# Patient Record
Sex: Female | Born: 1950 | Race: White | Hispanic: No | Marital: Married | Smoking: Former smoker
Health system: Southern US, Community
[De-identification: ages and names within clinical notes are randomized; demographics above are authoritative.]

## PROBLEM LIST (undated history)

## (undated) DIAGNOSIS — A64 Unspecified sexually transmitted disease: Secondary | ICD-10-CM

## (undated) DIAGNOSIS — F32A Depression, unspecified: Secondary | ICD-10-CM

## (undated) DIAGNOSIS — C50919 Malignant neoplasm of unspecified site of unspecified female breast: Secondary | ICD-10-CM

## (undated) DIAGNOSIS — F329 Major depressive disorder, single episode, unspecified: Secondary | ICD-10-CM

## (undated) DIAGNOSIS — F419 Anxiety disorder, unspecified: Secondary | ICD-10-CM

## (undated) DIAGNOSIS — M810 Age-related osteoporosis without current pathological fracture: Secondary | ICD-10-CM

## (undated) DIAGNOSIS — I10 Essential (primary) hypertension: Secondary | ICD-10-CM

## (undated) HISTORY — DX: Anxiety disorder, unspecified: F41.9

## (undated) HISTORY — PX: KNEE SURGERY: SHX244

## (undated) HISTORY — DX: Major depressive disorder, single episode, unspecified: F32.9

## (undated) HISTORY — PX: OTHER SURGICAL HISTORY: SHX169

## (undated) HISTORY — PX: COLPOSCOPY: SHX161

## (undated) HISTORY — DX: Unspecified sexually transmitted disease: A64

## (undated) HISTORY — DX: Malignant neoplasm of unspecified site of unspecified female breast: C50.919

## (undated) HISTORY — DX: Depression, unspecified: F32.A

## (undated) HISTORY — DX: Essential (primary) hypertension: I10

## (undated) HISTORY — DX: Age-related osteoporosis without current pathological fracture: M81.0

---

## 2008-09-25 HISTORY — PX: OTHER SURGICAL HISTORY: SHX169

## 2010-01-25 HISTORY — PX: MASTECTOMY: SHX3

## 2011-11-10 ENCOUNTER — Encounter: Payer: Self-pay | Admitting: Family Medicine

## 2011-12-14 ENCOUNTER — Encounter: Payer: Self-pay | Admitting: *Deleted

## 2011-12-14 DIAGNOSIS — C50919 Malignant neoplasm of unspecified site of unspecified female breast: Secondary | ICD-10-CM | POA: Insufficient documentation

## 2011-12-14 DIAGNOSIS — I10 Essential (primary) hypertension: Secondary | ICD-10-CM | POA: Insufficient documentation

## 2011-12-14 DIAGNOSIS — F329 Major depressive disorder, single episode, unspecified: Secondary | ICD-10-CM | POA: Insufficient documentation

## 2011-12-15 ENCOUNTER — Ambulatory Visit (INDEPENDENT_AMBULATORY_CARE_PROVIDER_SITE_OTHER): Payer: No Typology Code available for payment source | Admitting: Family Medicine

## 2011-12-15 ENCOUNTER — Encounter: Payer: Self-pay | Admitting: Family Medicine

## 2011-12-15 VITALS — BP 103/67 | HR 69 | Temp 97.4°F | Resp 16 | Ht 65.5 in | Wt 153.2 lb

## 2011-12-15 DIAGNOSIS — F341 Dysthymic disorder: Secondary | ICD-10-CM

## 2011-12-15 DIAGNOSIS — Z0000 Encounter for general adult medical examination without abnormal findings: Secondary | ICD-10-CM

## 2011-12-15 DIAGNOSIS — J301 Allergic rhinitis due to pollen: Secondary | ICD-10-CM

## 2011-12-15 DIAGNOSIS — I10 Essential (primary) hypertension: Secondary | ICD-10-CM

## 2011-12-15 DIAGNOSIS — M8580 Other specified disorders of bone density and structure, unspecified site: Secondary | ICD-10-CM

## 2011-12-15 DIAGNOSIS — M899 Disorder of bone, unspecified: Secondary | ICD-10-CM

## 2011-12-15 DIAGNOSIS — F329 Major depressive disorder, single episode, unspecified: Secondary | ICD-10-CM

## 2011-12-15 MED ORDER — ESCITALOPRAM OXALATE 20 MG PO TABS: 20.0000 mg | ORAL_TABLET | Freq: Every day | ORAL | Status: DC

## 2011-12-15 MED ORDER — FLUTICASONE PROPIONATE 50 MCG/ACT NA SUSP: 1.0000 | Freq: Every day | NASAL | Status: DC

## 2011-12-15 MED ORDER — LISINOPRIL-HYDROCHLOROTHIAZIDE 10-12.5 MG PO TABS: 1.0000 | ORAL_TABLET | Freq: Every day | ORAL | Status: DC

## 2011-12-15 MED ORDER — AZELASTINE HCL 0.05 % OP SOLN: 1.0000 [drp] | Freq: Two times a day (BID) | OPHTHALMIC | Status: AC

## 2011-12-15 NOTE — Patient Instructions (Addendum)
Allergies - continue allegra, flonase, and try optivar - if not improving - return for recheck.   Keeping You Healthy  Get These Tests  Blood Pressure- Have your blood pressure checked by your healthcare provider at least once a year.  Normal blood pressure is 120/80.  Weight- Have your body mass index (BMI) calculated to screen for obesity.  BMI is a measure of body fat based on height and weight.  You can calculate your own BMI at https://www.west-esparza.com/  Cholesterol- Have your cholesterol checked every year.  Diabetes- Have your blood sugar checked every year if you have high blood pressure, high cholesterol, a family history of diabetes or if you are overweight.  Pap Smear- Have a pap smear every 1 to 3 years if you have been sexually active.  If you are older than 65 and recent pap smears have been normal you may not need additional pap smears.  In addition, if you have had a hysterectomy  For benign disease additional pap smears are not necessary.  Mammogram-Yearly mammograms are essential for early detection of breast cancer  Screening for Colon Cancer- Colonoscopy starting at age 62. Screening may begin sooner depending on your family history and other health conditions.  Follow up colonoscopy as directed by your Gastroenterologist.  Screening for Osteoporosis- Screening begins at age 68 with bone density scanning, sooner if you are at higher risk for developing Osteoporosis.  Get these medicines  Calcium with Vitamin D- Your body requires 1200-1500 mg of Calcium a day and [phone] IU of Vitamin D a day.  You can only absorb 500 mg of Calcium at a time therefore Calcium must be taken in 2 or 3 separate doses throughout the day.  Hormones- Hormone therapy has been associated with increased risk for certain cancers and heart disease.  Talk to your healthcare provider about if you need relief from menopausal symptoms.  Aspirin- Ask your healthcare provider about taking Aspirin to  prevent Heart Disease and Stroke.  Get these Immuniztions  Flu shot- Every fall  Pneumonia shot- Once after the age of 59; if you are younger ask your healthcare provider if you need a pneumonia shot.  Tetanus- Every ten years.  Zostavax- Once after the age of 87 to prevent shingles.  Take these steps  Don't smoke- Your healthcare provider can help you quit. For tips on how to quit, ask your healthcare provider or go to www.smokefree.gov or call 1-800 QUIT-NOW.  Be physically active- Exercise 5 days a week for a minimum of 30 minutes.  If you are not already physically active, start slow and gradually work up to 30 minutes of moderate physical activity.  Try walking, dancing, bike riding, swimming, etc.  Eat a healthy diet- Eat a variety of healthy foods such as fruits, vegetables, whole grains, low fat milk, low fat cheeses, yogurt, lean meats, chicken, fish, eggs, dried beans, tofu, etc.  For more information go to www.thenutritionsource.org  Dental visit- Brush and floss teeth twice daily; visit your dentist twice a year.  Eye exam- Visit your Optometrist or Ophthalmologist yearly.  Drink alcohol in moderation- Limit alcohol intake to one drink or less a day.  Never drink and drive.  Depression- Your emotional health is as important as your physical health.  If you're feeling down or losing interest in things you normally enjoy, please talk to your healthcare provider.  Seat Belts- can save your life; always wear one  Smoke/Carbon Monoxide detectors- These detectors need to be installed on  the appropriate level of your home.  Replace batteries at least once a year.  Violence- If anyone is threatening or hurting you, please tell your healthcare provider.  Living Will/ Health care power of attorney- Discuss with your healthcare provider and family.

## 2011-12-15 NOTE — Progress Notes (Signed)
  Subjective:    Patient ID: Angelica Patton, female    DOB: 03/15/1951, 61 y.o.   MRN: [id number]  HPI [name] is a 61 y.o. female here for CPE. See pt health survey -  HTN - tolerating meds. Depression -  Allergies - zyrtec too sleepy.  Allegra, Flonase, works better, but eyes red, itchy.  Cats at home.  F  Review of Systems See 13pt ROS on PHS - reviewed.      Objective:   Physical Exam  Constitutional: She is oriented to person, place, and time. She appears well-developed and well-nourished.  HENT:  Head: Normocephalic and atraumatic.  Right Ear: External ear normal.  Left Ear: External ear normal.  Mouth/Throat: No oropharyngeal exudate.  Eyes: Conjunctivae and EOM are normal. Pupils are equal, round, and reactive to light.  Neck: Normal range of motion. No JVD present. No thyromegaly present.  Cardiovascular: Normal rate, regular rhythm and normal heart sounds.   No murmur heard. Pulmonary/Chest: Effort normal and breath sounds normal. She has no rales.  Abdominal: Soft. Bowel sounds are normal. There is no tenderness.  Musculoskeletal: Normal range of motion.  Lymphadenopathy:    She has no cervical adenopathy.  Neurological: She is alert and oriented to person, place, and time.  Skin: Skin is warm and dry.  Psychiatric: She has a normal mood and affect. Her behavior is normal.         Assessment & Plan:  [name] is a 61 y.o. female 1. Annual physical exam  DG Bone Density, Comprehensive metabolic panel, Lipid panel, CBC with Differential, TSH  2. Depression    3. HTN (hypertension)  Comprehensive metabolic panel, Lipid panel, CBC with Differential, TSH  4. Osteopenia  DG Bone Density  5. Allergic rhinitis     Check labs as above, add optivar for allergies - consider immunocap testing.  Stable BP and depression.  No change in meds.  Hx osteopenia, but off meds at previous doctors request.  Ca and vit d otc, recheck Dexa.  Has OBGYN for pap testing  follow up and f/u breast CA.  Recheck 6months.

## 2011-12-16 LAB — COMPREHENSIVE METABOLIC PANEL
AST: 16 U/L (ref 0–37)
Albumin: 4.3 g/dL (ref 3.5–5.2)
Alkaline Phosphatase: 46 U/L (ref 39–117)
BUN: 14 mg/dL (ref 6–23)
Creat: 0.85 mg/dL (ref 0.50–1.10)
Glucose, Bld: 96 mg/dL (ref 70–99)
Potassium: 4.2 mEq/L (ref 3.5–5.3)
Total Bilirubin: 0.3 mg/dL (ref 0.3–1.2)

## 2011-12-16 LAB — LIPID PANEL
HDL: 80 mg/dL (ref >39)
LDL Cholesterol: 70 mg/dL (ref 0–99)
Total CHOL/HDL Ratio: 2 Ratio
Triglycerides: 40 mg/dL (ref <150)

## 2011-12-16 LAB — CBC WITH DIFFERENTIAL/PLATELET
Basophils Absolute: 0.1 10*3/uL (ref 0.0–0.1)
Basophils Relative: 1 % (ref 0–1)
Eosinophils Relative: 2 % (ref 0–5)
HCT: 41.5 % (ref 36.0–46.0)
MCHC: 33.3 g/dL (ref 30.0–36.0)
Monocytes Absolute: 0.7 10*3/uL (ref 0.1–1.0)
Neutro Abs: 4.1 10*3/uL (ref 1.7–7.7)
Platelets: 272 10*3/uL (ref 150–400)
RDW: 12.7 % (ref 11.5–15.5)

## 2011-12-20 ENCOUNTER — Telehealth: Payer: Self-pay

## 2011-12-20 DIAGNOSIS — M8580 Other specified disorders of bone density and structure, unspecified site: Secondary | ICD-10-CM

## 2011-12-20 NOTE — Telephone Encounter (Signed)
1)  PT IS WAITING FOR HER REFERRAL FOR A BONE DENSITY TEST  2) PT IS WAITING FOR HER LAB RESULTS   PLEASE CALL [phone]

## 2011-12-21 NOTE — Telephone Encounter (Signed)
Dr. Neva Seat,  I see where lab results are in, and bone density ordered-- but referral is not listed in referrals section and I do not see where labs have been reviewed.  Please advise.

## 2011-12-25 NOTE — Telephone Encounter (Signed)
1)  PT IS WAITING FOR HER REFERRAL FOR A BONE DENSITY TEST  2) PT IS WAITING FOR HER LAB RESULTS   PLEASE CALL [phone]

## 2011-12-30 NOTE — Telephone Encounter (Signed)
I believe it just needs to be ordered as a dexa scan.  I will give it a try.

## 2011-12-30 NOTE — Telephone Encounter (Signed)
Referrals,  Please check and make sure Dexa scan was ordered for this patient.  We aren't sure if we did this right!  Pls notify Dr. Neva Seat if not.  Angelica Patton

## 2011-12-30 NOTE — Telephone Encounter (Signed)
Please arrange bone density test - I am not sure how this is to be placed in referrals.

## 2012-07-15 ENCOUNTER — Ambulatory Visit (INDEPENDENT_AMBULATORY_CARE_PROVIDER_SITE_OTHER): Payer: No Typology Code available for payment source | Admitting: Family Medicine

## 2012-07-15 ENCOUNTER — Encounter: Payer: Self-pay | Admitting: Family Medicine

## 2012-07-15 VITALS — BP 107/71 | HR 77 | Temp 97.6°F | Resp 16 | Ht 65.5 in | Wt 140.0 lb

## 2012-07-15 DIAGNOSIS — L709 Acne, unspecified: Secondary | ICD-10-CM

## 2012-07-15 DIAGNOSIS — I10 Essential (primary) hypertension: Secondary | ICD-10-CM

## 2012-07-15 DIAGNOSIS — R5383 Other fatigue: Secondary | ICD-10-CM

## 2012-07-15 DIAGNOSIS — F329 Major depressive disorder, single episode, unspecified: Secondary | ICD-10-CM

## 2012-07-15 DIAGNOSIS — Z23 Encounter for immunization: Secondary | ICD-10-CM

## 2012-07-15 DIAGNOSIS — L708 Other acne: Secondary | ICD-10-CM

## 2012-07-15 DIAGNOSIS — A6000 Herpesviral infection of urogenital system, unspecified: Secondary | ICD-10-CM

## 2012-07-15 MED ORDER — HYDROCHLOROTHIAZIDE 12.5 MG PO TABS: 12.5000 mg | ORAL_TABLET | Freq: Every day | ORAL | Status: DC

## 2012-07-15 MED ORDER — TRETINOIN 0.01 % EX GEL: Freq: Every day | CUTANEOUS | Status: DC

## 2012-07-15 MED ORDER — VALACYCLOVIR HCL 500 MG PO TABS: 500.0000 mg | ORAL_TABLET | ORAL | Status: DC | PRN

## 2012-07-15 MED ORDER — ESCITALOPRAM OXALATE 20 MG PO TABS: 20.0000 mg | ORAL_TABLET | Freq: Every day | ORAL | Status: DC

## 2012-07-15 NOTE — Progress Notes (Signed)
  Subjective:    Patient ID: Angelica Patton, female    DOB: Feb 21, 1951, 61 y.o.   MRN: [id number]  HPI Angelica Patton is a 61 y.o. female Has obgyn appt end of November.  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  Only needs to take every 3 or 4 weeks if feeling like symptoms are starting.  1 pill per day for 3 days.   Depression, with anxiety and worry at times.  takes lexapro 20mg  qd.  Had been taking 10mg  past year- had been doing well, no side effects.   Felt more "blah" past 6 weeks - more depressed. Down feeling. Rare etoh - rare glass or two of wine. No recent change in amount.  Has been quitting smoking. Exercising, but fatigued.  Sore with exercising - feels depressed about this.  Anhedonia past 2 weeks.   HTN - no outside BP's.  Has been feeling ok.   Hx of acne - used retin a in  past - didn't; get rx last time.   Review of Systems  Constitutional: Positive for fever. Negative for fatigue and unexpected weight change.  Respiratory: Negative for chest tightness and shortness of breath.   Cardiovascular: Negative for chest pain, palpitations and leg swelling.  Gastrointestinal: Negative for abdominal pain and blood in stool.  Neurological: Negative for dizziness, syncope, light-headedness and headaches.       Objective:   Physical Exam  Constitutional: She is oriented to person, place, and time. She appears well-developed and well-nourished.  HENT:  Head: Normocephalic and atraumatic.  Eyes: Conjunctivae normal and EOM are normal. Pupils are equal, round, and reactive to light.  Neck: Carotid bruit is not present. No thyromegaly present.  Cardiovascular: Normal rate, regular rhythm, normal heart sounds and intact distal pulses.   Pulmonary/Chest: Effort normal and breath sounds normal.  Abdominal: Soft. She exhibits no pulsatile midline mass. There is no tenderness.  Neurological: She is alert and oriented to person, place, and time.  Skin: Skin is warm and dry.    Psychiatric: She has a normal mood and affect. Her behavior is normal. Judgment and thought content normal.          Assessment & Plan:  Angelica Patton is a 61 y.o. female 1. HTN (hypertension)  hydrochlorothiazide (HYDRODIURIL) 12.5 MG tablet  2. Need for prophylactic vaccination and inoculation against influenza  Flu vaccine greater than or equal to 3yo preservative free IM  3. Depression  escitalopram (LEXAPRO) 20 MG tablet  4. Fatigue    5. Genital HSV  valACYclovir (VALTREX) 500 MG tablet  6. Acne  tretinoin (RETIN-A) 0.01 % gel    Genital HSV - discussed increasing dose of 500mg  to twice per day for 3 days if feels like having flair.  meds refilled - 30, 3 rf's. . Consider daily tx of increased frequency of flairs.   Depression with anxiety, recent increase of anhedonia.- increase lexapro back to 20mg  qd, and recheck in 4- 6 weeks.  Continue exercise.    HTN - great control today.  Possible overtreatment with contributor to fatigue.. Change to hctz 12.5mg  qd, and check home bp's - recheck in 4-6 weeks, with possibe labs then if fatigue persists.  Acne - refilled Retin A.  Flu vaccine given.

## 2012-07-15 NOTE — Patient Instructions (Addendum)
Keep a record of your blood pressures outside of the office and bring them to the next office visit. If remaining over 130/80, we can change back to previous medicine. Return to the clinic or go to the nearest emergency room if any of your symptoms worsen or new symptoms occur. Recheck in 4- 6 weeks.

## 2012-07-29 ENCOUNTER — Telehealth: Payer: Self-pay

## 2012-07-29 NOTE — Telephone Encounter (Signed)
I called patient and she states her BP has been 127/80, states when it is this high, it makes her feel bad.(she states she can feel it when her BP is this high) She states she no longer has the fatigue she had before. She states her BP on the other medication was 100/70, she wants to know what to do, does she need to continue or change to something else.

## 2012-07-29 NOTE — Telephone Encounter (Signed)
Pt called and would like to speak to clinical staff re: recent medication Hydrodiuril.  She is feeling better however her bp seems to be increasing.    [phone] best number.

## 2012-07-30 NOTE — Telephone Encounter (Signed)
See staff message

## 2012-08-05 NOTE — Telephone Encounter (Signed)
Left message for her, to call me back. Dr Neva Seat advised to continue with current treatment. Need to advise her.

## 2012-08-05 NOTE — Telephone Encounter (Signed)
Patient now states she is feeling better, has adjusted to the medication. She will see you in December. States thank you.  FYI only

## 2012-08-26 ENCOUNTER — Ambulatory Visit: Payer: No Typology Code available for payment source | Admitting: Family Medicine

## 2012-08-26 ENCOUNTER — Ambulatory Visit (INDEPENDENT_AMBULATORY_CARE_PROVIDER_SITE_OTHER): Payer: No Typology Code available for payment source | Admitting: Family Medicine

## 2012-08-26 VITALS — BP 120/80 | HR 82 | Temp 98.8°F | Resp 16 | Ht 66.5 in | Wt 150.0 lb

## 2012-08-26 DIAGNOSIS — F329 Major depressive disorder, single episode, unspecified: Secondary | ICD-10-CM

## 2012-08-26 DIAGNOSIS — M549 Dorsalgia, unspecified: Secondary | ICD-10-CM

## 2012-08-26 DIAGNOSIS — F341 Dysthymic disorder: Secondary | ICD-10-CM

## 2012-08-26 DIAGNOSIS — I10 Essential (primary) hypertension: Secondary | ICD-10-CM

## 2012-08-26 DIAGNOSIS — M546 Pain in thoracic spine: Secondary | ICD-10-CM

## 2012-08-26 MED ORDER — MELOXICAM 7.5 MG PO TABS: 7.5000 mg | ORAL_TABLET | Freq: Every day | ORAL | Status: DC

## 2012-08-26 NOTE — Patient Instructions (Signed)
For your back pain - work of on the stretches we discussed, heat, and if needed - I prescribed mobic to your pharmacy if needed - one per day. Watch blood pressures on this medicine - if going up - stop this medicine.   Continue same doses of other medications.   Recheck in 3 months.Keep a record of your blood pressures outside of the office and bring them to the next office visit. Return to the clinic or go to the nearest emergency room if any of your symptoms worsen or new symptoms occur.

## 2012-08-26 NOTE — Progress Notes (Signed)
Subjective:    Patient ID: Angelica Patton, female    DOB: [birth year], 61 y.o.   MRN: [id number]  HPI Angelica Patton is a 61 y.o. female  Last OV 07/15/12:  Depression with anxiety, recent increase of anhedonia.- increased lexapro back to 20mg  qd, recommended continued exercise.  Does not feel depressed.  No anhedonia. No SI. Few glasses of wine few times per week. No recent increase.  Mood is better and more energy. Has had some congestion, cough since flu shot.  HTN - great control last ov.   Possible overtreatment with contributor to fatigue then. Changed to hctz 12.5mg  qd. Telephone notes reviewed. Weight 140 last office visit. Feels better - more energy. Outside readings - 120/88. Feels good at this dose.   Back pain - started after cold symptoms after having flu shot.  Cough, used mucinexfor few weeks. Cough is better, but back sore for past 2 weeks - noted in evenings with more stress in day.  Had massage - felt tight. NKI, no new activities otherwise. Past 2 days better than in past few weeks. Tx: Advil - few times at night. Initially helped then less. More sore at night. No fever, no sob.  No hemoptysis. No weakness. Pain is between shoulder blades and moves to mid back.  No neck pain. No bowel or bladder incontinence, no saddle anesthesia, no lower extremity weakness.    Review of Systems  Constitutional: Negative for fever and chills.  Respiratory: Positive for cough. Negative for chest tightness.   Cardiovascular: Negative for chest pain.  Musculoskeletal: Positive for myalgias.  Neurological: Negative for dizziness and light-headedness.   As above.     Objective:   Physical Exam  Constitutional: She is oriented to person, place, and time. She appears well-developed and well-nourished.  HENT:  Head: Normocephalic and atraumatic.  Eyes: Conjunctivae normal and EOM are normal. Pupils are equal, round, and reactive to light.  Neck: Carotid bruit is not present.  Cardiovascular:  Normal rate, regular rhythm, normal heart sounds and intact distal pulses.   Pulmonary/Chest: Effort normal and breath sounds normal.  Abdominal: Soft. She exhibits no pulsatile midline mass. There is no tenderness.  Musculoskeletal:       Cervical back: She exhibits normal range of motion and no tenderness.       Thoracic back: She exhibits normal range of motion and no bony tenderness.       Lumbar back: She exhibits normal range of motion and no tenderness.       Back:  Neurological: She is alert and oriented to person, place, and time.  Skin: Skin is warm and dry.  Psychiatric: She has a normal mood and affect. Her behavior is normal.          Assessment & Plan:  Angelica Patton is a 61 y.o. female 1. Mid back pain  meloxicam (MOBIC) 7.5 MG tablet  2. HTN (hypertension)    3. Depression     Depression - controlled, continue same dose of Lexapro. Discussed increase in exercise.   HTN - controlled.  Continue same dose of HCTZ, and recheck in 3-6 months.  Mid back pain - rhomboid spasm/strain likely after cough with likely viral illness. Demonstrated exercise/stretces, trial of heat, stretches.  mobic if needed, rtc precautions - recheck if not continuing to improve next few weeks.   Patient Instructions  For your back pain - work of on the stretches we discussed, heat, and if needed - I prescribed mobic to your pharmacy  if needed - one per day. Watch blood pressures on this medicine - if going up - stop this medicine.   Continue same doses of other medications.   Recheck in 3 months.Keep a record of your blood pressures outside of the office and bring them to the next office visit. Return to the clinic or go to the nearest emergency room if any of your symptoms worsen or new symptoms occur.

## 2012-10-11 ENCOUNTER — Other Ambulatory Visit: Payer: Self-pay | Admitting: Family Medicine

## 2012-12-24 ENCOUNTER — Other Ambulatory Visit: Payer: Self-pay | Admitting: Family Medicine

## 2013-01-06 ENCOUNTER — Other Ambulatory Visit: Payer: Self-pay | Admitting: Physician Assistant

## 2013-01-30 ENCOUNTER — Other Ambulatory Visit: Payer: Self-pay | Admitting: Family Medicine

## 2013-02-24 ENCOUNTER — Other Ambulatory Visit: Payer: Self-pay | Admitting: Family Medicine

## 2013-03-24 ENCOUNTER — Ambulatory Visit: Payer: No Typology Code available for payment source | Admitting: Family Medicine

## 2013-03-31 ENCOUNTER — Encounter: Payer: Self-pay | Admitting: Family Medicine

## 2013-03-31 ENCOUNTER — Ambulatory Visit (INDEPENDENT_AMBULATORY_CARE_PROVIDER_SITE_OTHER): Payer: BC Managed Care – PPO | Admitting: Family Medicine

## 2013-03-31 VITALS — BP 106/70 | HR 84 | Temp 97.8°F | Resp 16 | Ht 66.0 in | Wt 146.0 lb

## 2013-03-31 DIAGNOSIS — F329 Major depressive disorder, single episode, unspecified: Secondary | ICD-10-CM

## 2013-03-31 DIAGNOSIS — I10 Essential (primary) hypertension: Secondary | ICD-10-CM

## 2013-03-31 DIAGNOSIS — L708 Other acne: Secondary | ICD-10-CM

## 2013-03-31 DIAGNOSIS — J309 Allergic rhinitis, unspecified: Secondary | ICD-10-CM

## 2013-03-31 DIAGNOSIS — L709 Acne, unspecified: Secondary | ICD-10-CM

## 2013-03-31 MED ORDER — TRETINOIN 0.05 % EX CREA: TOPICAL_CREAM | Freq: Every day | CUTANEOUS | Status: DC

## 2013-03-31 MED ORDER — ESCITALOPRAM OXALATE 20 MG PO TABS: 20.0000 mg | ORAL_TABLET | Freq: Every day | ORAL | Status: DC

## 2013-03-31 MED ORDER — HYDROCHLOROTHIAZIDE 12.5 MG PO CAPS: ORAL_CAPSULE | ORAL | Status: DC

## 2013-03-31 MED ORDER — FLUTICASONE PROPIONATE 50 MCG/ACT NA SUSP: NASAL | Status: DC

## 2013-03-31 NOTE — Progress Notes (Signed)
Subjective:    Patient ID: Angelica Patton, female    DOB: 18-Feb-1951, 62 y.o.   MRN: [id number]  HPI Angelica Patton is a 62 y.o. female  HTN - not checking home BP's.  No recent elevated levels. Less fatigue. Changed to 12.5mg  HCTZ last October of last year and has done well. Not fasting today. No new side effects of meds.   AR - no recent use of flonase as had some nasal irritation, stopped 2 months ago.  but now knows how to use correctly, so will restart.   Depression - mood doing ok.  No suicide thoughts, no anhedonia.  Recently retired from [employer]. No recent change in sleep.  Feels well otherwise.   Acne - pores around nose. Prior on 0.05 Retin A, last time had lower dose. Notices more pores. Would like to try higher dose again -  No skin irritation with this higher dose.   Results for orders placed in visit on 12/15/11  COMPREHENSIVE METABOLIC PANEL      Result Value Range   Sodium 140  135 - 145 mEq/L   Potassium 4.2  3.5 - 5.3 mEq/L   Chloride 105  96 - 112 mEq/L   CO2 27  19 - 32 mEq/L   Glucose, Bld 96  70 - 99 mg/dL   BUN 14  6 - 23 mg/dL   Creat 4.03  4.74 - 2.59 mg/dL   Total Bilirubin 0.3  0.3 - 1.2 mg/dL   Alkaline Phosphatase 46  39 - 117 U/L   AST 16  0 - 37 U/L   ALT 14  0 - 35 U/L   Total Protein 6.4  6.0 - 8.3 g/dL   Albumin 4.3  3.5 - 5.2 g/dL   Calcium 8.9  8.4 - 56.3 mg/dL  LIPID PANEL      Result Value Range   Cholesterol 158  0 - 200 mg/dL   Triglycerides 40  <875 mg/dL   HDL 80  >64 mg/dL   Total CHOL/HDL Ratio 2.0     VLDL 8  0 - 40 mg/dL   LDL Cholesterol 70  0 - 99 mg/dL  CBC WITH DIFFERENTIAL      Result Value Range   WBC 7.3  4.0 - 10.5 K/uL   RBC 4.50  3.87 - 5.11 MIL/uL   Hemoglobin 13.8  12.0 - 15.0 g/dL   HCT 33.2  95.1 - 88.4 %   MCV 92.2  78.0 - 100.0 fL   MCH 30.7  26.0 - 34.0 pg   MCHC 33.3  30.0 - 36.0 g/dL   RDW 16.6  06.3 - 01.6 %   Platelets 272  150 - 400 K/uL   Neutrophils Relative % 56  43 - 77 %   Neutro Abs  4.1  1.7 - 7.7 K/uL   Lymphocytes Relative 33  12 - 46 %   Lymphs Abs 2.4  0.7 - 4.0 K/uL   Monocytes Relative 9  3 - 12 %   Monocytes Absolute 0.7  0.1 - 1.0 K/uL   Eosinophils Relative 2  0 - 5 %   Eosinophils Absolute 0.1  0.0 - 0.7 K/uL   Basophils Relative 1  0 - 1 %   Basophils Absolute 0.1  0.0 - 0.1 K/uL   Smear Review Criteria for review not met    TSH      Result Value Range   TSH 3.409  0.350 - 4.500 uIU/mL  Review of Systems  Constitutional: Negative for fatigue and unexpected weight change.  Respiratory: Negative for chest tightness and shortness of breath.   Cardiovascular: Negative for chest pain, palpitations and leg swelling.  Gastrointestinal: Negative for abdominal pain and blood in stool.  Neurological: Negative for dizziness, syncope, light-headedness and headaches.       Objective:   Physical Exam  Vitals reviewed. Constitutional: She is oriented to person, place, and time. She appears well-developed and well-nourished.  HENT:  Head: Normocephalic and atraumatic.  Few dark scattered pores/small comedones lateral aspect of nose.   Eyes: Conjunctivae and EOM are normal. Pupils are equal, round, and reactive to light.  Neck: Carotid bruit is not present.  Cardiovascular: Normal rate, regular rhythm, normal heart sounds and intact distal pulses.   Pulmonary/Chest: Effort normal and breath sounds normal.  Abdominal: Soft. She exhibits no pulsatile midline mass. There is no tenderness.  Neurological: She is alert and oriented to person, place, and time.  Skin: Skin is warm and dry.  Psychiatric: She has a normal mood and affect. Her behavior is normal.       Assessment & Plan:  Angelica Patton is a 62 y.o. female HTN (hypertension) - Plan: hydrochlorothiazide (MICROZIDE) 12.5 MG capsule, Basic metabolic panel ordered.  meds refilled.   Acne - Plan: tretinoin (RETIN-A) 0.05 % cream ordered inplace of prior lower dose - only uses this intermittently.    Depression - stable.  Plan: escitalopram (LEXAPRO) 20 MG tablet refilled.   Allergic rhinitis - Plan: fluticasone (FLONASE) 50 MCG/ACT nasal spray - discussed "nose to toes, near to ear" technique for improved tolerance.  If still with irritation, consider other agent.   Plan on CPE with fasting bloodwork in 6 months.   Meds ordered this encounter  Medications  . tretinoin (RETIN-A) 0.05 % cream    Sig: Apply topically at bedtime.    Dispense:  90 g    Refill:  1  . hydrochlorothiazide (MICROZIDE) 12.5 MG capsule    Sig: TAKE 1 TABLET BY MOUTH EVERY DAY    Dispense:  90 capsule    Refill:  1  . fluticasone (FLONASE) 50 MCG/ACT nasal spray    Sig: SPRAY ONCE INTO EACH NOSTRIL EVERY DAY    Dispense:  16 g    Refill:  2  . escitalopram (LEXAPRO) 20 MG tablet    Sig: Take 1 tablet (20 mg total) by mouth daily.    Dispense:  90 tablet    Refill:  1   Patient Instructions  You should receive a call or letter about your lab results within the next week to 10 days.

## 2013-03-31 NOTE — Patient Instructions (Signed)
You should receive a call or letter about your lab results within the next week to 10 days.   

## 2013-04-01 ENCOUNTER — Encounter: Payer: Self-pay | Admitting: Family Medicine

## 2013-04-01 ENCOUNTER — Other Ambulatory Visit: Payer: Self-pay | Admitting: Family Medicine

## 2013-04-01 LAB — BASIC METABOLIC PANEL
BUN: 16 mg/dL (ref 6–23)
Creat: 0.86 mg/dL (ref 0.50–1.10)

## 2013-05-29 ENCOUNTER — Other Ambulatory Visit: Payer: Self-pay | Admitting: Family Medicine

## 2013-08-09 ENCOUNTER — Other Ambulatory Visit: Payer: Self-pay | Admitting: Family Medicine

## 2013-10-02 ENCOUNTER — Other Ambulatory Visit: Payer: Self-pay | Admitting: Family Medicine

## 2013-10-02 DIAGNOSIS — F329 Major depressive disorder, single episode, unspecified: Secondary | ICD-10-CM

## 2013-10-02 DIAGNOSIS — F32A Depression, unspecified: Secondary | ICD-10-CM

## 2013-10-03 NOTE — Telephone Encounter (Signed)
Pt has an appt with you later this month. Please advise refill prior to appt.

## 2013-10-04 NOTE — Telephone Encounter (Signed)
Ok to refill - sent. Keep appt with me as scheduled to discuss med.

## 2013-10-06 ENCOUNTER — Encounter: Payer: BC Managed Care – PPO | Admitting: Family Medicine

## 2013-10-09 NOTE — Telephone Encounter (Signed)
Tried to call pt to advise to keep appt, but her only phone # has been disconnected.

## 2013-10-16 ENCOUNTER — Other Ambulatory Visit: Payer: Self-pay | Admitting: Family Medicine

## 2013-10-20 ENCOUNTER — Encounter: Payer: BC Managed Care – PPO | Admitting: Family Medicine

## 2013-11-17 ENCOUNTER — Other Ambulatory Visit: Payer: Self-pay | Admitting: Family Medicine

## 2013-12-06 ENCOUNTER — Ambulatory Visit (INDEPENDENT_AMBULATORY_CARE_PROVIDER_SITE_OTHER): Payer: 59 | Admitting: Emergency Medicine

## 2013-12-06 VITALS — BP 140/78 | HR 73 | Temp 97.9°F | Resp 17 | Ht 65.5 in | Wt 154.2 lb

## 2013-12-06 DIAGNOSIS — F329 Major depressive disorder, single episode, unspecified: Secondary | ICD-10-CM

## 2013-12-06 DIAGNOSIS — F3289 Other specified depressive episodes: Secondary | ICD-10-CM

## 2013-12-06 DIAGNOSIS — I10 Essential (primary) hypertension: Secondary | ICD-10-CM

## 2013-12-06 DIAGNOSIS — F32A Depression, unspecified: Secondary | ICD-10-CM

## 2013-12-06 MED ORDER — HYDROCHLOROTHIAZIDE 12.5 MG PO CAPS: 12.5000 mg | ORAL_CAPSULE | Freq: Every day | ORAL | Status: DC

## 2013-12-06 MED ORDER — ESCITALOPRAM OXALATE 20 MG PO TABS: ORAL_TABLET | ORAL | Status: DC

## 2013-12-06 NOTE — Patient Instructions (Signed)

## 2013-12-06 NOTE — Progress Notes (Signed)
[facility] [address], [place] [address] [phone]  Date:  12/06/2013   Name:  Angelica Patton   DOB:  08-17-1951   MRN:  [id number]  PCP:  No primary provider on file.    Chief Complaint: Medication Refill   History of Present Illness:  Angelica Patton is a 63 y.o. very pleasant female patient who presents with the following:  Says she has run out of medications for hypertension.  Is tolerating the medication well with no evidence for end organ injury.  Last labs in 7/13.  Not fasting.  No improvement with over the counter medications or other home remedies. Denies other complaint or health concern today.   Patient Active Problem List   Diagnosis Date Noted  . Hypertension   . Depression   . Breast cancer     Past Medical History  Diagnosis Date  . Hypertension   . Depression   . Breast cancer     s/p double mastectomy 01/2010    Past Surgical History  Procedure Laterality Date  . Double mastectomy    . [facility] procedure    . Knee surgery      History  Substance Use Topics  . Smoking status: Former Smoker -- 15 years    Types: Cigarettes    Quit date: 12/06/2012  . Smokeless tobacco: Not on file     Comment: quit over a year ago  . Alcohol Use: Yes     Comment: moderate-wine 5 glasses per weeks    Family History  Problem Relation Age of Onset  . Ulcers Father   . Heart failure Mother     No Known Allergies  Medication list has been reviewed and updated.  Current Outpatient Prescriptions on File Prior to Visit  Medication Sig Dispense Refill  . aspirin 81 MG tablet Take 81 mg by mouth daily.      . cholecalciferol (VITAMIN D) 1000 UNITS tablet Take 1,000 Units by mouth daily.      Marland Kitchen escitalopram (LEXAPRO) 20 MG tablet TAKE 1 TABLET BY MOUTH EVERY DAY  90 tablet  1  . fexofenadine (ALLEGRA) 180 MG tablet Take 180 mg by mouth daily.      . fluticasone (FLONASE) 50 MCG/ACT nasal spray SPRAY ONCE IN EACH NOSTRIL EVERY DAY  16 g  8   . hydrochlorothiazide (MICROZIDE) 12.5 MG capsule Take 1 capsule (12.5 mg total) by mouth daily. PATIENT NEEDS OFFICE VISIT FOR ADDITIONAL REFILLS  30 capsule  0  . Multiple Vitamins-Minerals (MULTIVITAMIN PO) Take by mouth.      . tretinoin (RETIN-A) 0.05 % cream Apply topically at bedtime.  90 g  1  . valACYclovir (VALTREX) 500 MG tablet Take 1 tablet (500 mg total) by mouth as needed.  30 tablet  3   No current facility-administered medications on file prior to visit.    Review of Systems:  As per HPI, otherwise negative.    Physical Examination: Filed Vitals:   12/06/13 1536  BP: 140/78  Pulse: 73  Temp: 97.9 F (36.6 C)  Resp: 17   Filed Vitals:   12/06/13 1536  Height: 5' 5.5" (1.664 m)  Weight: 154 lb 4 oz (69.967 kg)   Body mass index is 25.27 kg/(m^2). Ideal Body Weight: Weight in (lb) to have BMI = 25: 152.2  GEN: WDWN, NAD, Non-toxic, A & O x 3 HEENT: Atraumatic, Normocephalic. Neck supple. No masses, No LAD. Ears and Nose: No external deformity. CV: RRR,  No M/G/R. No JVD. No thrill. No extra heart sounds. PULM: CTA B, no wheezes, crackles, rhonchi. No retractions. No resp. distress. No accessory muscle use. ABD: S, NT, ND, +BS. No rebound. No HSM. EXTR: No c/c/e NEURO Normal gait.  PSYCH: Normally interactive. Conversant. Not depressed or anxious appearing.  Calm demeanor.    Assessment and Plan: Hypertension Refills Labs as future   Signed,  Ellison Carwin, MD

## 2013-12-10 ENCOUNTER — Telehealth: Payer: Self-pay | Admitting: General Practice

## 2013-12-10 NOTE — Telephone Encounter (Signed)
Called patient to schedule a fu appointment w.Dr Tamala Julian in June, but the patients vm hasnt been setup yet

## 2014-01-16 ENCOUNTER — Telehealth: Payer: Self-pay

## 2014-01-16 DIAGNOSIS — M8580 Other specified disorders of bone density and structure, unspecified site: Secondary | ICD-10-CM

## 2014-01-16 NOTE — Telephone Encounter (Signed)
Orders Placed This Encounter  Procedures  . DG Bone Density    Standing Status: Future     Number of Occurrences:      Standing Expiration Date: 03/19/2015    Order Specific Question:  Reason for Exam (SYMPTOM  OR DIAGNOSIS REQUIRED)    Answer:  osteopenia    Order Specific Question:  Preferred imaging location?    Answer:  External     Comments:  [facility]

## 2014-01-16 NOTE — Telephone Encounter (Signed)
Patient is requesting a referral for bone density to be done at [facility].  She said she has discussed this with Dr. Ouida Sills, but referral has not been made yet.  Please advise.

## 2014-01-16 NOTE — Telephone Encounter (Signed)
Pt is requesting a referral to have a bone density test at [facility]

## 2014-01-17 NOTE — Telephone Encounter (Signed)
Phone number out of service. Unable to reach her to let her know that we sent in for a referral. Can we mail her a letter when we get appt set up for her please. Thanks

## 2014-06-15 LAB — HM COLONOSCOPY

## 2014-06-16 ENCOUNTER — Telehealth: Payer: Self-pay

## 2014-06-16 NOTE — Telephone Encounter (Signed)
Patient says she is having difficulty with insurance claim for bone density for her insurance. She says that the diagnosis code for her bone density should have said 54yr check for date of service of order: May 20th 2015. She is asking if we can change that and resubmit. She is also calling [facility] to ask the same.    Best: [phone]

## 2016-03-16 ENCOUNTER — Ambulatory Visit (INDEPENDENT_AMBULATORY_CARE_PROVIDER_SITE_OTHER): Payer: PRIVATE HEALTH INSURANCE | Admitting: Obstetrics & Gynecology

## 2016-03-16 ENCOUNTER — Encounter: Payer: Self-pay | Admitting: Obstetrics & Gynecology

## 2016-03-16 VITALS — BP 116/80 | HR 68 | Resp 16 | Ht 66.0 in | Wt 148.6 lb

## 2016-03-16 DIAGNOSIS — A6000 Herpesviral infection of urogenital system, unspecified: Secondary | ICD-10-CM | POA: Diagnosis not present

## 2016-03-16 DIAGNOSIS — Z01419 Encounter for gynecological examination (general) (routine) without abnormal findings: Secondary | ICD-10-CM | POA: Diagnosis not present

## 2016-03-16 DIAGNOSIS — M8580 Other specified disorders of bone density and structure, unspecified site: Secondary | ICD-10-CM

## 2016-03-16 DIAGNOSIS — Z0000 Encounter for general adult medical examination without abnormal findings: Secondary | ICD-10-CM

## 2016-03-16 DIAGNOSIS — Z124 Encounter for screening for malignant neoplasm of cervix: Secondary | ICD-10-CM | POA: Diagnosis not present

## 2016-03-16 DIAGNOSIS — IMO0002 Reserved for concepts with insufficient information to code with codable children: Secondary | ICD-10-CM

## 2016-03-16 LAB — POCT URINALYSIS DIPSTICK
Bilirubin, UA: NEGATIVE
Blood, UA: NEGATIVE
GLUCOSE UA: NEGATIVE
Ketones, UA: NEGATIVE
Leukocytes, UA: NEGATIVE
Nitrite, UA: NEGATIVE
PROTEIN UA: NEGATIVE
UROBILINOGEN UA: NEGATIVE
pH, UA: 7

## 2016-03-16 MED ORDER — VALACYCLOVIR HCL 500 MG PO TABS: 500.0000 mg | ORAL_TABLET | ORAL | Status: DC | PRN

## 2016-03-16 NOTE — Progress Notes (Signed)
65 y.o. G3P3. Married Caucasian F here for annual exam.  Moved from [place] about five years ago.  Has just moved her care from [facility] in the last year.  Has some questions about a PCP in [place], [place].  H/o breast cancer in 2011.  Initially treated with lumpectomy.  Margins were positive and so she had bilateral mastectomy, non nipple sparing.  Saw breast surgeon every yearly for the past two years.  He developed Parkinson's.  No vaginal bleeding.  Was never on HRT.    PCP:  Dr. Noah Delaine.  Does lab work yearly.  No LMP recorded. Patient is postmenopausal.          Sexually active: Yes.    The current method of family planning is post menopausal status.    Exercising: No.  The patient does not participate in regular exercise at present. Smoker:  no  Health Maintenance: Pap: last year per patient, normal per patient History of abnormal Pap:  yes MMG:  S/P bilateral mastectomy 2011 Colonoscopy:  2 years ago per patient, normal per patient.  Dr. Collene Mares.   BMD:   2 years ago, osteopenia  TDaP:  Up to date Pneumonia vaccine(s):  Pt states unknown Zostavax:   2016  Hep C testing: declined Screening Labs: PCP, Hb today: PCP, Urine today: normal   reports that she quit smoking about 3 years ago. Her smoking use included Cigarettes. She quit after 15 years of use. She does not have any smokeless tobacco history on file. She reports that she drinks alcohol. She reports that she does not use illicit drugs.  Past Medical History  Diagnosis Date  . Hypertension   . Depression   . Breast cancer [facility])     s/p double mastectomy 01/2010    Past Surgical History  Procedure Laterality Date  . Double mastectomy    . [facility] procedure    . Knee surgery      Current Outpatient Prescriptions  Medication Sig Dispense Refill  . aspirin 81 MG tablet Take 81 mg by mouth daily.    . cholecalciferol (VITAMIN D) 1000 UNITS tablet Take 1,000 Units by mouth daily.    Marland Kitchen escitalopram (LEXAPRO) 20 MG  tablet TAKE 1 TABLET BY MOUTH EVERY DAY 90 tablet 3  . fexofenadine (ALLEGRA) 180 MG tablet Take 180 mg by mouth daily.    . fluticasone (FLONASE) 50 MCG/ACT nasal spray SPRAY ONCE IN EACH NOSTRIL EVERY DAY 16 g 8  . Multiple Vitamins-Minerals (MULTIVITAMIN PO) Take by mouth.    . Probiotic Product (ACIDOPHILUS/GOAT MILK) CAPS Take by mouth.    . valACYclovir (VALTREX) 500 MG tablet Take 1 tablet (500 mg total) by mouth as needed. 30 tablet 3  . tretinoin (RETIN-A) 0.05 % cream Apply topically at bedtime. (Patient not taking: Reported on 03/16/2016) 90 g 1   No current facility-administered medications for this visit.    Family History  Problem Relation Age of Onset  . Ulcers Father   . Heart failure Mother     ROS:  Pertinent items are noted in HPI.  Otherwise, a comprehensive ROS was negative.  Exam:   BP 116/80 mmHg  Pulse 68  Resp 16  Ht 5\' 6"  (1.676 m)  Wt 148 lb 9.6 oz (67.405 kg)  BMI 24.00 kg/m2  Height: 5\' 6"  (167.6 cm)  Ht Readings from Last 3 Encounters:  03/16/16 5\' 6"  (1.676 m)  12/06/13 5' 5.5" (1.664 m)  03/31/13 5\' 6"  (1.676 m)    General  appearance: alert, cooperative and appears stated age Head: Normocephalic, without obvious abnormality, atraumatic Neck: no adenopathy, supple, symmetrical, trachea midline and thyroid normal to inspection and palpation Lungs: clear to auscultation bilaterally Breasts: bilateral implants, no masses, no LAD, s/p bilateral mastectomy Heart: regular rate and rhythm Abdomen: soft, non-tender; bowel sounds normal; no masses,  no organomegaly Extremities: extremities normal, atraumatic, no cyanosis or edema Skin: Skin color, texture, turgor normal. No rashes or lesions Lymph nodes: Cervical, supraclavicular, and axillary nodes normal. No abnormal inguinal nodes palpated Neurologic: Grossly normal   Pelvic: External genitalia:  no lesions              Urethra:  normal appearing urethra with no masses, tenderness or lesions               Bartholins and Skenes: normal                 Vagina: normal appearing vagina with normal color and discharge, no lesions              Cervix: no lesions and s/p conization with scarring.  Stenotic os present.              Pap taken: Yes.   Bimanual Exam:  Uterus:  normal size, contour, position, consistency, mobility, non-tender              Adnexa: normal adnexa and no mass, fullness, tenderness               Rectovaginal: Confirms               Anus:  normal sphincter tone, no lesions  Chaperone was present for exam.  A:  Well Woman with normal exam H/O breast cancer, pt unsure of type, s/p bilateral mastectomy.  Chemo and/or radiation was not needed H/O HSV H/O abnormal pap smears with conization <10 years ago Recent pneumonia, much improved  P:   Mammogram not indicated due to prior pap smear with HR HPV obtained today BMD ordered.  Pt will call to schedule Valtrex rx to pt to use if needed Pt will see PCP in another month.  Will check to see if needs Hep C testing.  Will also ask about pneumonia vaccine. AEX 1 year or follow up prn return annually or prn

## 2016-03-21 LAB — IPS PAP TEST WITH HPV

## 2016-03-22 NOTE — Addendum Note (Signed)
Addended by: Megan Salon on: 03/22/2016 02:50 PM   Modules accepted: Orders, SmartSet

## 2016-03-30 LAB — IPS HPV GENOTYPING 16/18

## 2016-04-13 ENCOUNTER — Telehealth: Payer: Self-pay | Admitting: Internal Medicine

## 2016-04-13 NOTE — Telephone Encounter (Signed)
called

## 2016-04-13 NOTE — Telephone Encounter (Signed)
Pt state that she has been trying to reach your office but has not been able to get anyone to answer.  Please call pt at [phone]

## 2016-04-25 ENCOUNTER — Telehealth: Payer: Self-pay | Admitting: Obstetrics & Gynecology

## 2016-04-26 NOTE — Telephone Encounter (Signed)
Left message to call Raquel Sarna at [phone] to review BMD results.

## 2016-04-26 NOTE — Telephone Encounter (Signed)
Patient returned called. Reviewed BMD results with patient and she verbalized understanding. Consult to review results with Dr. Sabra Heck scheduled for Thursday 05/11/2016 at 1345. Patient agreeable to date and time.

## 2016-05-04 ENCOUNTER — Encounter: Payer: Self-pay | Admitting: Obstetrics & Gynecology

## 2016-05-04 DIAGNOSIS — M8580 Other specified disorders of bone density and structure, unspecified site: Secondary | ICD-10-CM

## 2016-05-11 ENCOUNTER — Telehealth: Payer: Self-pay | Admitting: Obstetrics & Gynecology

## 2016-05-11 ENCOUNTER — Ambulatory Visit: Payer: Self-pay | Admitting: Obstetrics & Gynecology

## 2016-05-11 NOTE — Telephone Encounter (Signed)
Patient arrived for her BMD consult appointment and said she was really pressed for time. Patient needed to know exactly how long she would be here. Patient rescheduled to 05/16/16 at 8:30am.

## 2016-05-16 ENCOUNTER — Ambulatory Visit: Payer: Self-pay | Admitting: Obstetrics & Gynecology

## 2016-05-23 ENCOUNTER — Ambulatory Visit (INDEPENDENT_AMBULATORY_CARE_PROVIDER_SITE_OTHER): Payer: PRIVATE HEALTH INSURANCE | Admitting: Obstetrics & Gynecology

## 2016-05-23 ENCOUNTER — Encounter: Payer: Self-pay | Admitting: Obstetrics & Gynecology

## 2016-05-23 VITALS — BP 106/70 | HR 70 | Resp 16 | Ht 66.0 in | Wt 154.0 lb

## 2016-05-23 DIAGNOSIS — M8580 Other specified disorders of bone density and structure, unspecified site: Secondary | ICD-10-CM

## 2016-05-23 DIAGNOSIS — M810 Age-related osteoporosis without current pathological fracture: Secondary | ICD-10-CM | POA: Diagnosis not present

## 2016-05-23 NOTE — Progress Notes (Signed)
Subjective:    50 yrs Married Caucasian G63P1200  female here to discuss recent BMD obtained 04/13/16 showing osteoporosis in her spine only.  T score -2.7.  Hips with moderate osteopenia that has been fairly stable over last few years.  Pt did take Fosamax in the past.  Really does not want to be on any additional medications, if possible.  Meeting with trainer for the first time today.  D/w pt discussing specific activities for her back/spine.     Osteoporosis Risk Factors  Nonmodifiable  Personal Hx of fracture as an adult: yes - wrist fracture about 5 years and leg bone fracture 15 years ago Hx of fracture in first-degree relative: no Caucasian race: yes Advanced age: no Dementia: no Poor health/frailty: no  Potentially modifiable: Tobacco use: no Low body weight (<127 lbs): no Estrogen deficiency  early menopause (age <45) or bilateral ovariectomy: no  prolonged premenopausal amenorrhea (>1 yr): no Low calcium intake (lifelong): yes Alcohol use more than 2 drinks per day: no Recurrent falls: no Inadequate physical activity: has not been working out regularly but is going to start with a trainer today  Current calcium and Vit D intake: in MVI and 1000IU Vit D daily  Review of Systems Pertinent items are noted in HPI.     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  Pt will do CMP, TSH, Vit D, and PTH with pcp next month.  Has lab work done there.  Order given for PTH. 2.  Increased exercise and weight bearing exericise. 3.  Increase calcium intake to 500mg  bid in divided doses 4.  Repeat BMD 2 years.  Pt in agreement with plan and  comfortable with watching this.  ~13 minutes spent with patient with 100% of time was in face to face discussion of above.

## 2016-07-03 ENCOUNTER — Encounter: Payer: Self-pay | Admitting: Internal Medicine

## 2016-07-03 ENCOUNTER — Ambulatory Visit (INDEPENDENT_AMBULATORY_CARE_PROVIDER_SITE_OTHER): Payer: No Typology Code available for payment source | Admitting: Internal Medicine

## 2016-07-03 VITALS — BP 124/88 | HR 76 | Temp 97.6°F | Resp 16 | Ht 66.0 in | Wt 157.0 lb

## 2016-07-03 DIAGNOSIS — Z23 Encounter for immunization: Secondary | ICD-10-CM

## 2016-07-03 DIAGNOSIS — Z1159 Encounter for screening for other viral diseases: Secondary | ICD-10-CM

## 2016-07-03 DIAGNOSIS — F419 Anxiety disorder, unspecified: Secondary | ICD-10-CM | POA: Diagnosis not present

## 2016-07-03 DIAGNOSIS — J309 Allergic rhinitis, unspecified: Secondary | ICD-10-CM

## 2016-07-03 DIAGNOSIS — F329 Major depressive disorder, single episode, unspecified: Secondary | ICD-10-CM

## 2016-07-03 DIAGNOSIS — Z8701 Personal history of pneumonia (recurrent): Secondary | ICD-10-CM | POA: Diagnosis not present

## 2016-07-03 DIAGNOSIS — I10 Essential (primary) hypertension: Secondary | ICD-10-CM

## 2016-07-03 DIAGNOSIS — Z0000 Encounter for general adult medical examination without abnormal findings: Secondary | ICD-10-CM

## 2016-07-03 DIAGNOSIS — Z853 Personal history of malignant neoplasm of breast: Secondary | ICD-10-CM

## 2016-07-03 DIAGNOSIS — F32A Depression, unspecified: Secondary | ICD-10-CM

## 2016-07-03 MED ORDER — DULOXETINE HCL 30 MG PO CPEP
30.0000 mg | ORAL_CAPSULE | Freq: Every day | ORAL | 3 refills | Status: DC
Start: 2016-07-03 — End: 2016-07-04

## 2016-07-03 MED ORDER — ALPRAZOLAM 0.25 MG PO TABS: 0.2500 mg | ORAL_TABLET | Freq: Every evening | ORAL | 0 refills | Status: DC | PRN

## 2016-07-03 NOTE — Assessment & Plan Note (Signed)
Not ideally controlled Taper off lexapro Start cymbalta occ difficulty sleeping - often due to working late and not being able to turn her head off Will prescribe small amt of xanax - discussed risk of addiction and possible long term side effects, no alcohol when she takes the medication Advised to take xanax only as needed - will monitor

## 2016-07-03 NOTE — Patient Instructions (Signed)
Have blood work done one week prior to your physical  Test(s) ordered today. Your results will be released to [facility] (or called to you) after review, usually within 72hours after test completion. If any changes need to be made, you will be notified at that same time.  All other Health Maintenance issues reviewed.   All recommended immunizations and age-appropriate screenings are up-to-date or discussed.  Flu vaccine administered today.   Medications reviewed and updated.  Changes include tapering off the lexapro and starting cymbalta as discussed.  I have also given you a few xanax - take as needed.   Your prescription(s) have been submitted to your pharmacy. Please take as directed and contact our office if you believe you are having problem(s) with the medication(s).   Please followup in 2-3 months for a physical

## 2016-07-03 NOTE — Progress Notes (Signed)
Pre visit review using our clinic review tool, if applicable. No additional management support is needed unless otherwise documented below in the visit note. 

## 2016-07-03 NOTE — Progress Notes (Signed)
Subjective:    Patient ID: Angelica Patton, female    DOB: 11-23-1950, 65 y.o.   MRN: [id number]  HPI She is here to establish with a new pcp.    Depression, anxiety: She has a lot of stress in her life.  She is taking her lexapro daily as prescribed, but does not feel it is working as well.  She has taken a couple of her friends xanax to help her sleep at night and it has helped - she wonders if she can have some to use only if needed if she can not sleep. She denies any side effects from the medication. She feels her depression is controlled, but her anxiety is not controlled. She has been on the lexapro for a long time.   Difficulty sleeping:  She has occasional difficulty sleeping and wonders about xanax as needed.  She is exercising reguarly - she started two weeks ago.   Seasonal allergies:  She is taking all of her medications as prescribed.  She feels her allergies are well controlled.   History of breast cancer:  She had b/l mastectomies.  She did not need any additional treatment after that.  She sees her gyn routinely.    Medications and allergies reviewed with patient and updated if appropriate.  Patient Active Problem List   Diagnosis Date Noted  . History of pneumonia 07/03/2016  . Anxiety 07/03/2016  . Allergic rhinitis 07/03/2016  . History of breast cancer 07/03/2016  . Osteoporosis 05/23/2016  . Hypertension   . Depression   . Breast cancer [facility])     Current Outpatient Prescriptions on File Prior to Visit  Medication Sig Dispense Refill  . aspirin 81 MG tablet Take 81 mg by mouth daily.    Marland Kitchen azelastine (ASTELIN) 0.1 % nasal spray PLACE 1 PUFF IN EACH NOSTRIL TWICE A DAY  5  . cholecalciferol (VITAMIN D) 1000 UNITS tablet Take 1,000 Units by mouth daily.    . fluticasone (FLONASE) 50 MCG/ACT nasal spray SPRAY ONCE IN EACH NOSTRIL EVERY DAY 16 g 8  . levocetirizine (XYZAL) 5 MG tablet Take 5 mg by mouth every evening.  5  . montelukast (SINGULAIR) 10 MG tablet  Take 10 mg by mouth every evening.  5  . Multiple Vitamins-Minerals (MULTIVITAMIN PO) Take by mouth.    Marland Kitchen olopatadine (PATANOL) 0.1 % ophthalmic solution PLACE 1 DROP INTO AFFECTED EYE TWICE A DAY  5  . Probiotic Product (ACIDOPHILUS/GOAT MILK) CAPS Take by mouth.    . valACYclovir (VALTREX) 500 MG tablet Take 1 tablet (500 mg total) by mouth as needed. 30 tablet 3   No current facility-administered medications on file prior to visit.     Past Medical History:  Diagnosis Date  . Anxiety   . Breast cancer [facility])    s/p double mastectomy 01/2010  . Depression   . Hypertension   . Osteoporosis   . STD (sexually transmitted disease)    Herpes    Past Surgical History:  Procedure Laterality Date  . COLPOSCOPY    . [facility] procedure  2010   unsure about date  . double mastectomy    . KNEE SURGERY    . MASTECTOMY  01/25/2010   Bilateral     Social History   Social History  . Marital status: Married    Spouse name: N/A  . Number of children: N/A  . Years of education: N/A   Occupational History  . [employer]  History Main Topics  . Smoking status: Former Smoker    Years: 15.00    Types: Cigarettes    Quit date: 12/06/2012  . Smokeless tobacco: Never Used     Comment: quit over a year ago  . Alcohol use Yes     Comment: moderate-wine 5 glasses per weeks  . Drug use: No  . Sexual activity: Yes    Partners: Male    Birth control/ protection: Post-menopausal   Other Topics Concern  . None   Social History Narrative  . None    Family History  Problem Relation Age of Onset  . Ulcers Father   . Alcohol abuse Father   . Heart failure Mother   . Alcohol abuse Mother   . Heart disease Mother   . Depression Sister   . Depression Daughter   . Cancer Paternal Grandmother     Review of Systems  Constitutional: Negative for appetite change, chills, fatigue, fever and unexpected weight change.  Respiratory: Negative for cough, shortness of breath and  wheezing.   Cardiovascular: Positive for leg swelling (occ). Negative for chest pain and palpitations.  Gastrointestinal:       Jerrye Bushy occ  Genitourinary: Negative for dysuria and hematuria.  Neurological: Negative for dizziness, light-headedness and headaches.  Psychiatric/Behavioral: Positive for dysphoric mood. The patient is nervous/anxious.        Objective:   Vitals:   07/03/16 1439  BP: 124/88  Pulse: 76  Resp: 16  Temp: 97.6 F (36.4 C)   Filed Weights   07/03/16 1439  Weight: 157 lb (71.2 kg)   Body mass index is 25.34 kg/m.   Physical Exam Constitutional: Appears well-developed and well-nourished. No distress.  HENT:  Head: Normocephalic and atraumatic.  Neck: Neck supple. No tracheal deviation present. No thyromegaly present.  No cervical lymphadenopathy Cardiovascular: Normal rate, regular rhythm and normal heart sounds.   No murmur heard. No carotid bruit .  No edema Pulmonary/Chest: Effort normal and breath sounds normal. No respiratory distress. No has no wheezes. No rales.  Abdomen: soft, nontender, nondistended Skin: Skin is warm and dry. Not diaphoretic.  Psychiatric: Normal mood and affect. Behavior is normal.          Assessment & Plan:   See Problem List for Assessment and Plan of chronic medical problems.  Follow up in 2-3 months for CPE

## 2016-07-03 NOTE — Assessment & Plan Note (Signed)
Controlled.   -Continue current medication

## 2016-07-03 NOTE — Assessment & Plan Note (Signed)
Controlled with lifestyle No medication needed Monitor She is working on weight loss and is exercising regularly

## 2016-07-03 NOTE — Assessment & Plan Note (Signed)
Follows gyn - Dr Sabra Heck

## 2016-07-03 NOTE — Assessment & Plan Note (Addendum)
Taper off lexapro - 10 mg for one week then stop Start cymbalta 30 mg daily Call with questions Follow up in 2-3 months,sooner if needed

## 2016-07-04 ENCOUNTER — Telehealth: Payer: Self-pay | Admitting: Emergency Medicine

## 2016-07-04 MED ORDER — FLUOXETINE HCL 20 MG PO TABS: 20.0000 mg | ORAL_TABLET | Freq: Every day | ORAL | 3 refills | Status: DC

## 2016-07-04 NOTE — Telephone Encounter (Signed)
Pt returned your call and asked you call her back when you get a chance. Thanks.

## 2016-07-04 NOTE — Telephone Encounter (Signed)
Pt called and stated she went by to pick up her prescription DULoxetine (CYMBALTA) 30 MG capsule. It cost too much and wants to know if she can get something cheaper. Please advise thanks.

## 2016-07-04 NOTE — Telephone Encounter (Signed)
Does she want to retry the sertraline or prozac

## 2016-07-04 NOTE — Telephone Encounter (Signed)
LVM for pt to call back and discuss.  

## 2016-07-04 NOTE — Telephone Encounter (Signed)
Fluoxetine (prozac) sent to [facility].

## 2016-07-04 NOTE — Addendum Note (Signed)
Addended by: Binnie Rail on: 07/04/2016 09:46 PM   Modules accepted: Orders

## 2016-07-04 NOTE — Telephone Encounter (Signed)
Spoke with pt. She is okay with trying either medication.

## 2016-07-04 NOTE — Telephone Encounter (Signed)
Please advise 

## 2016-08-31 ENCOUNTER — Other Ambulatory Visit (INDEPENDENT_AMBULATORY_CARE_PROVIDER_SITE_OTHER): Payer: No Typology Code available for payment source

## 2016-08-31 DIAGNOSIS — Z1159 Encounter for screening for other viral diseases: Secondary | ICD-10-CM

## 2016-08-31 DIAGNOSIS — Z0000 Encounter for general adult medical examination without abnormal findings: Secondary | ICD-10-CM | POA: Diagnosis not present

## 2016-08-31 LAB — CBC WITH DIFFERENTIAL/PLATELET
BASOS PCT: 0.7 % (ref 0.0–3.0)
Basophils Absolute: 0 10*3/uL (ref 0.0–0.1)
EOS ABS: 0.1 10*3/uL (ref 0.0–0.7)
EOS PCT: 1.2 % (ref 0.0–5.0)
HEMATOCRIT: 42 % (ref 36.0–46.0)
HEMOGLOBIN: 14.2 g/dL (ref 12.0–15.0)
LYMPHS PCT: 34.7 % (ref 12.0–46.0)
Lymphs Abs: 2.3 10*3/uL (ref 0.7–4.0)
MCHC: 33.8 g/dL (ref 30.0–36.0)
MCV: 90.1 fl (ref 78.0–100.0)
Monocytes Absolute: 0.5 10*3/uL (ref 0.1–1.0)
Monocytes Relative: 7.9 % (ref 3.0–12.0)
Neutro Abs: 3.7 10*3/uL (ref 1.4–7.7)
Neutrophils Relative %: 55.5 % (ref 43.0–77.0)
Platelets: 294 10*3/uL (ref 150.0–400.0)
RBC: 4.67 Mil/uL (ref 3.87–5.11)
RDW: 13.2 % (ref 11.5–15.5)
WBC: 6.7 10*3/uL (ref 4.0–10.5)

## 2016-08-31 LAB — COMPREHENSIVE METABOLIC PANEL
ALBUMIN: 4.5 g/dL (ref 3.5–5.2)
ALK PHOS: 45 U/L (ref 39–117)
ALT: 20 U/L (ref 0–35)
AST: 17 U/L (ref 0–37)
BILIRUBIN TOTAL: 0.5 mg/dL (ref 0.2–1.2)
BUN: 11 mg/dL (ref 6–23)
CALCIUM: 9.5 mg/dL (ref 8.4–10.5)
CHLORIDE: 106 meq/L (ref 96–112)
CO2: 27 mEq/L (ref 19–32)
CREATININE: 0.85 mg/dL (ref 0.40–1.20)
GFR: 71.63 mL/min (ref >60.00)
Glucose, Bld: 102 mg/dL — ABNORMAL HIGH (ref 70–99)
Potassium: 3.9 mEq/L (ref 3.5–5.1)
Sodium: 143 mEq/L (ref 135–145)
TOTAL PROTEIN: 7 g/dL (ref 6.0–8.3)

## 2016-08-31 LAB — LIPID PANEL
CHOLESTEROL: 192 mg/dL (ref 0–200)
HDL: 97.9 mg/dL (ref >39.00)
LDL Cholesterol: 85 mg/dL (ref 0–99)
NonHDL: 94
TRIGLYCERIDES: 47 mg/dL (ref 0.0–149.0)
Total CHOL/HDL Ratio: 2
VLDL: 9.4 mg/dL (ref 0.0–40.0)

## 2016-08-31 LAB — HEPATITIS C ANTIBODY: HCV Ab: NEGATIVE

## 2016-08-31 LAB — TSH: TSH: 4.2 u[IU]/mL (ref 0.35–4.50)

## 2016-09-06 ENCOUNTER — Ambulatory Visit (INDEPENDENT_AMBULATORY_CARE_PROVIDER_SITE_OTHER): Payer: No Typology Code available for payment source | Admitting: Internal Medicine

## 2016-09-06 ENCOUNTER — Encounter: Payer: Self-pay | Admitting: Internal Medicine

## 2016-09-06 VITALS — BP 132/86 | HR 73 | Temp 98.3°F | Resp 16 | Ht 66.0 in | Wt 152.0 lb

## 2016-09-06 DIAGNOSIS — Z1283 Encounter for screening for malignant neoplasm of skin: Secondary | ICD-10-CM

## 2016-09-06 DIAGNOSIS — F419 Anxiety disorder, unspecified: Secondary | ICD-10-CM

## 2016-09-06 DIAGNOSIS — Z853 Personal history of malignant neoplasm of breast: Secondary | ICD-10-CM

## 2016-09-06 DIAGNOSIS — Z0000 Encounter for general adult medical examination without abnormal findings: Secondary | ICD-10-CM | POA: Diagnosis not present

## 2016-09-06 DIAGNOSIS — F32A Depression, unspecified: Secondary | ICD-10-CM

## 2016-09-06 DIAGNOSIS — F329 Major depressive disorder, single episode, unspecified: Secondary | ICD-10-CM | POA: Diagnosis not present

## 2016-09-06 DIAGNOSIS — I10 Essential (primary) hypertension: Secondary | ICD-10-CM

## 2016-09-06 DIAGNOSIS — M810 Age-related osteoporosis without current pathological fracture: Secondary | ICD-10-CM

## 2016-09-06 MED ORDER — FLUOXETINE HCL 40 MG PO CAPS: 40.0000 mg | ORAL_CAPSULE | Freq: Every day | ORAL | 3 refills | Status: DC

## 2016-09-06 NOTE — Assessment & Plan Note (Signed)
Not ideally controlled Increase prozac to 40 mg daily - she will update me via mychart Xanax as needed only

## 2016-09-06 NOTE — Assessment & Plan Note (Signed)
Up to date with Dr Sabra Heck  No evidence of recurrence

## 2016-09-06 NOTE — Progress Notes (Signed)
Subjective:    Patient ID: Angelica Patton, female    DOB: 1951-03-06, 65 y.o.   MRN: [id number]  HPI She is here for a physical exam.   Anxeity, depression: she is taking the prozac daily as prescribed and does not feel like it is working well enough.  She still feels depressed and anxious.  She has only taking 2 xanax  Since she was here last.  Her daughter is an alcoholic and lost custody of her daughter.  She does get to see her granddaughter for one week every month.  Her husband lost his leg and has a prosthesis, but is having difficulty accepting it and has anger that he has taken out on her.  This is very unlike him and she is having a hard time dealing with this.     Medications and allergies reviewed with patient and updated if appropriate.  Patient Active Problem List   Diagnosis Date Noted  . History of pneumonia 07/03/2016  . Anxiety 07/03/2016  . Allergic rhinitis 07/03/2016  . History of breast cancer 07/03/2016  . Osteoporosis 05/23/2016  . Hypertension   . Depression   . Breast cancer [facility])     Current Outpatient Prescriptions on File Prior to Visit  Medication Sig Dispense Refill  . ALPRAZolam (XANAX) 0.25 MG tablet Take 1 tablet (0.25 mg total) by mouth at bedtime as needed for anxiety. 7 tablet 0  . aspirin 81 MG tablet Take 81 mg by mouth daily.    Marland Kitchen azelastine (ASTELIN) 0.1 % nasal spray PLACE 1 PUFF IN EACH NOSTRIL TWICE A DAY  5  . cholecalciferol (VITAMIN D) 1000 UNITS tablet Take 1,000 Units by mouth daily.    . fluticasone (FLONASE) 50 MCG/ACT nasal spray SPRAY ONCE IN EACH NOSTRIL EVERY DAY 16 g 8  . levocetirizine (XYZAL) 5 MG tablet Take 5 mg by mouth every evening.  5  . montelukast (SINGULAIR) 10 MG tablet Take 10 mg by mouth every evening.  5  . Multiple Vitamins-Minerals (MULTIVITAMIN PO) Take by mouth.    Marland Kitchen olopatadine (PATANOL) 0.1 % ophthalmic solution PLACE 1 DROP INTO AFFECTED EYE TWICE A DAY  5  . Probiotic Product (ACIDOPHILUS/GOAT MILK)  CAPS Take by mouth.    . valACYclovir (VALTREX) 500 MG tablet Take 1 tablet (500 mg total) by mouth as needed. 30 tablet 3   No current facility-administered medications on file prior to visit.     Past Medical History:  Diagnosis Date  . Anxiety   . Breast cancer [facility])    s/p double mastectomy 01/2010  . Depression   . Hypertension   . Osteoporosis   . STD (sexually transmitted disease)    Herpes    Past Surgical History:  Procedure Laterality Date  . COLPOSCOPY    . cone procedure  2010   unsure about date  . double mastectomy    . KNEE SURGERY    . MASTECTOMY  01/25/2010   Bilateral     Social History   Social History  . Marital status: Married    Spouse name: N/A  . Number of children: N/A  . Years of education: N/A   Occupational History  . mortgage lender    Social History Main Topics  . Smoking status: Former Smoker    Years: 15.00    Types: Cigarettes    Quit date: 12/06/2012  . Smokeless tobacco: Never Used     Comment: quit over a year ago  . Alcohol  use Yes     Comment: moderate-wine 5 glasses per weeks  . Drug use: No  . Sexual activity: Yes    Partners: Male    Birth control/ protection: Post-menopausal   Other Topics Concern  . Not on file   Social History Narrative  . No narrative on file    Family History  Problem Relation Age of Onset  . Ulcers Father   . Alcohol abuse Father   . Heart failure Mother   . Alcohol abuse Mother   . Heart disease Mother   . Depression Sister   . Depression Daughter   . Cancer Paternal Grandmother     Review of Systems  Constitutional: Negative for appetite change, chills, fatigue and fever.  Eyes: Negative for visual disturbance.  Respiratory: Negative for cough, shortness of breath and wheezing.   Cardiovascular: Negative for chest pain, palpitations and leg swelling.  Gastrointestinal: Negative for abdominal pain, blood in stool, constipation, diarrhea and nausea.       No gerd    Genitourinary: Negative for dysuria and hematuria.  Neurological: Negative for light-headedness and headaches.  Psychiatric/Behavioral: Negative for decreased concentration. The patient is nervous/anxious.        Objective:   Vitals:   09/06/16 1039  BP: 132/86  Pulse: 73  Resp: 16  Temp: 98.3 F (36.8 C)   Filed Weights   09/06/16 1039  Weight: 152 lb (68.9 kg)   Body mass index is 24.53 kg/m.   Physical Exam Constitutional: She appears well-developed and well-nourished. No distress.  HENT:  Head: Normocephalic and atraumatic.  Right Ear: External ear normal. Normal ear canal and TM Left Ear: External ear normal.  Normal ear canal and TM Mouth/Throat: Oropharynx is clear and moist.  Eyes: Conjunctivae and EOM are normal.  Neck: Neck supple. No tracheal deviation present. No thyromegaly present.  No carotid bruit  Cardiovascular: Normal rate, regular rhythm and normal heart sounds.   No murmur heard.  No edema. Pulmonary/Chest: Effort normal and breath sounds normal. No respiratory distress. She has no wheezes. She has no rales.  Breast: deferred to Gyn Abdominal: Soft. She exhibits no distension. There is no tenderness.  Lymphadenopathy: She has no cervical adenopathy.  Skin: Skin is warm and dry. She is not diaphoretic.  Psychiatric: Her mood and affect is anxious and depressed at times. Her behavior is normal.         Assessment & Plan:   Physical exam: Screening blood work orderd Immunizations     Up to date Colonoscopy  Up to date Mammogram -- s/p b/l mastectomies - not indicated Gyn   Up to date  - Dr Sabra Heck Dexa  Done 20017 - repeat in 2 years Eye exams  Up to date  Exercise -  regular Weight  Working on weight loss Skin no concerns - want skin check by derm -- will refer to Dr Allyson Sabal Substance abuse - none  See Problem List for Assessment and Plan of chronic medical problems.   F/u annually, sooner if needed

## 2016-09-06 NOTE — Patient Instructions (Addendum)
All other Health Maintenance issues reviewed.   All recommended immunizations and age-appropriate screenings are up-to-date or discussed.  No immunizations administered today.   Medications reviewed and updated.  Changes include increasing the prozac to 40 mg   Your prescription(s) have been submitted to your pharmacy. Please take as directed and contact our office if you believe you are having problem(s) with the medication(s).   Please followup in one year for a physical exam   Health Maintenance, Female Introduction Adopting a healthy lifestyle and getting preventive care can go a long way to promote health and wellness. Talk with your health care provider about what schedule of regular examinations is right for you. This is a good chance for you to check in with your provider about disease prevention and staying healthy. In between checkups, there are plenty of things you can do on your own. Experts have done a lot of research about which lifestyle changes and preventive measures are most likely to keep you healthy. Ask your health care provider for more information. Weight and diet Eat a healthy diet  Be sure to include plenty of vegetables, fruits, low-fat dairy products, and lean protein.  Do not eat a lot of foods high in solid fats, added sugars, or salt.  Get regular exercise. This is one of the most important things you can do for your health.  Most adults should exercise for at least 150 minutes each week. The exercise should increase your heart rate and make you sweat (moderate-intensity exercise).  Most adults should also do strengthening exercises at least twice a week. This is in addition to the moderate-intensity exercise. Maintain a healthy weight  Body mass index (BMI) is a measurement that can be used to identify possible weight problems. It estimates body fat based on height and weight. Your health care provider can help determine your BMI and help you achieve or  maintain a healthy weight.  For females 34 years of age and older:  A BMI below 18.5 is considered underweight.  A BMI of 18.5 to 24.9 is normal.  A BMI of 25 to 29.9 is considered overweight.  A BMI of 30 and above is considered obese. Watch levels of cholesterol and blood lipids  You should start having your blood tested for lipids and cholesterol at 63 years of age, then have this test every 5 years.  You may need to have your cholesterol levels checked more often if:  Your lipid or cholesterol levels are high.  You are older than 63 years of age.  You are at high risk for heart disease. Cancer screening Lung Cancer  Lung cancer screening is recommended for adults 40-70 years old who are at high risk for lung cancer because of a history of smoking.  A yearly low-dose CT scan of the lungs is recommended for people who:  Currently smoke.  Have quit within the past 15 years.  Have at least a 30-pack-year history of smoking. A pack year is smoking an average of one pack of cigarettes a day for 1 year.  Yearly screening should continue until it has been 15 years since you quit.  Yearly screening should stop if you develop a health problem that would prevent you from having lung cancer treatment. Breast Cancer  Practice breast self-awareness. This means understanding how your breasts normally appear and feel.  It also means doing regular breast self-exams. Let your health care provider know about any changes, no matter how small.  If you  are in your 20s or 30s, you should have a clinical breast exam (CBE) by a health care provider every 1-3 years as part of a regular health exam.  If you are 48 or older, have a CBE every year. Also consider having a breast X-ray (mammogram) every year.  If you have a family history of breast cancer, talk to your health care provider about genetic screening.  If you are at high risk for breast cancer, talk to your health care provider  about having an MRI and a mammogram every year.  Breast cancer gene (BRCA) assessment is recommended for women who have family members with BRCA-related cancers. BRCA-related cancers include:  Breast.  Ovarian.  Tubal.  Peritoneal cancers.  Results of the assessment will determine the need for genetic counseling and BRCA1 and BRCA2 testing. Cervical Cancer  Your health care provider may recommend that you be screened regularly for cancer of the pelvic organs (ovaries, uterus, and vagina). This screening involves a pelvic examination, including checking for microscopic changes to the surface of your cervix (Pap test). You may be encouraged to have this screening done every 3 years, beginning at age 12.  For women ages 21-65, health care providers may recommend pelvic exams and Pap testing every 3 years, or they may recommend the Pap and pelvic exam, combined with testing for human papilloma virus (HPV), every 5 years. Some types of HPV increase your risk of cervical cancer. Testing for HPV may also be done on women of any age with unclear Pap test results.  Other health care providers may not recommend any screening for nonpregnant women who are considered low risk for pelvic cancer and who do not have symptoms. Ask your health care provider if a screening pelvic exam is right for you.  If you have had past treatment for cervical cancer or a condition that could lead to cancer, you need Pap tests and screening for cancer for at least 20 years after your treatment. If Pap tests have been discontinued, your risk factors (such as having a new sexual partner) need to be reassessed to determine if screening should resume. Some women have medical problems that increase the chance of getting cervical cancer. In these cases, your health care provider may recommend more frequent screening and Pap tests. Colorectal Cancer  This type of cancer can be detected and often prevented.  Routine colorectal  cancer screening usually begins at 63 years of age and continues through 63 years of age.  Your health care provider may recommend screening at an earlier age if you have risk factors for colon cancer.  Your health care provider may also recommend using home test kits to check for hidden blood in the stool.  A small camera at the end of a tube can be used to examine your colon directly (sigmoidoscopy or colonoscopy). This is done to check for the earliest forms of colorectal cancer.  Routine screening usually begins at age 10.  Direct examination of the colon should be repeated every 5-10 years through 63 years of age. However, you may need to be screened more often if early forms of precancerous polyps or small growths are found. Skin Cancer  Check your skin from head to toe regularly.  Tell your health care provider about any new moles or changes in moles, especially if there is a change in a mole's shape or color.  Also tell your health care provider if you have a mole that is larger than the size  of a pencil eraser.  Always use sunscreen. Apply sunscreen liberally and repeatedly throughout the day.  Protect yourself by wearing long sleeves, pants, a wide-brimmed hat, and sunglasses whenever you are outside. Heart disease, diabetes, and high blood pressure  High blood pressure causes heart disease and increases the risk of stroke. High blood pressure is more likely to develop in:  People who have blood pressure in the high end of the normal range (130-139/85-89 mm Hg).  People who are overweight or obese.  People who are African American.  If you are 60-74 years of age, have your blood pressure checked every 3-5 years. If you are 37 years of age or older, have your blood pressure checked every year. You should have your blood pressure measured twice-once when you are at a hospital or clinic, and once when you are not at a hospital or clinic. Record the average of the two  measurements. To check your blood pressure when you are not at a hospital or clinic, you can use:  An automated blood pressure machine at a pharmacy.  A home blood pressure monitor.  If you are between 50 years and 63 years old, ask your health care provider if you should take aspirin to prevent strokes.  Have regular diabetes screenings. This involves taking a blood sample to check your fasting blood sugar level.  If you are at a normal weight and have a low risk for diabetes, have this test once every three years after 63 years of age.  If you are overweight and have a high risk for diabetes, consider being tested at a younger age or more often. Preventing infection Hepatitis B  If you have a higher risk for hepatitis B, you should be screened for this virus. You are considered at high risk for hepatitis B if:  You were born in a country where hepatitis B is common. Ask your health care provider which countries are considered high risk.  Your parents were born in a high-risk country, and you have not been immunized against hepatitis B (hepatitis B vaccine).  You have HIV or AIDS.  You use needles to inject street drugs.  You live with someone who has hepatitis B.  You have had sex with someone who has hepatitis B.  You get hemodialysis treatment.  You take certain medicines for conditions, including cancer, organ transplantation, and autoimmune conditions. Hepatitis C  Blood testing is recommended for:  Everyone born from [birth year] through 1963.  Anyone with known risk factors for hepatitis C. Sexually transmitted infections (STIs)  You should be screened for sexually transmitted infections (STIs) including gonorrhea and chlamydia if:  You are sexually active and are younger than 63 years of age.  You are older than 63 years of age and your health care provider tells you that you are at risk for this type of infection.  Your sexual activity has changed since you were last  screened and you are at an increased risk for chlamydia or gonorrhea. Ask your health care provider if you are at risk.  If you do not have HIV, but are at risk, it may be recommended that you take a prescription medicine daily to prevent HIV infection. This is called pre-exposure prophylaxis (PrEP). You are considered at risk if:  You are sexually active and do not regularly use condoms or know the HIV status of your partner(s).  You take drugs by injection.  You are sexually active with a partner who has HIV. Talk  with your health care provider about whether you are at high risk of being infected with HIV. If you choose to begin PrEP, you should first be tested for HIV. You should then be tested every 3 months for as long as you are taking PrEP. Pregnancy  If you are premenopausal and you may become pregnant, ask your health care provider about preconception counseling.  If you may become pregnant, take 400 to 800 micrograms (mcg) of folic acid every day.  If you want to prevent pregnancy, talk to your health care provider about birth control (contraception). Osteoporosis and menopause  Osteoporosis is a disease in which the bones lose minerals and strength with aging. This can result in serious bone fractures. Your risk for osteoporosis can be identified using a bone density scan.  If you are 67 years of age or older, or if you are at risk for osteoporosis and fractures, ask your health care provider if you should be screened.  Ask your health care provider whether you should take a calcium or vitamin D supplement to lower your risk for osteoporosis.  Menopause may have certain physical symptoms and risks.  Hormone replacement therapy may reduce some of these symptoms and risks. Talk to your health care provider about whether hormone replacement therapy is right for you. Follow these instructions at home:  Schedule regular health, dental, and eye exams.  Stay current with your  immunizations.  Do not use any tobacco products including cigarettes, chewing tobacco, or electronic cigarettes.  If you are pregnant, do not drink alcohol.  If you are breastfeeding, limit how much and how often you drink alcohol.  Limit alcohol intake to no more than 1 drink per day for nonpregnant women. One drink equals 12 ounces of beer, 5 ounces of wine, or 1 ounces of hard liquor.  Do not use street drugs.  Do not share needles.  Ask your health care provider for help if you need support or information about quitting drugs.  Tell your health care provider if you often feel depressed.  Tell your health care provider if you have ever been abused or do not feel safe at home. This information is not intended to replace advice given to you by your health care provider. Make sure you discuss any questions you have with your health care provider. Document Released: 03/27/2011 Document Revised: 02/17/2016 Document Reviewed: 06/15/2015  2017 [employer]

## 2016-09-06 NOTE — Assessment & Plan Note (Signed)
Discussed OP with Dr Sabra Heck  Has been on fosamax in the past and wants to avoid additional meds Calcium, vitamin D Exercising regularly, working with a trainer Repeat Dexa in 2 years

## 2016-09-06 NOTE — Assessment & Plan Note (Signed)
BP controlled at home and here Continue to monitor off medication

## 2016-09-06 NOTE — Progress Notes (Signed)
Pre visit review using our clinic review tool, if applicable. No additional management support is needed unless otherwise documented below in the visit note. 

## 2016-09-06 NOTE — Assessment & Plan Note (Signed)
Not ideally controlled Increase prozac to 40 mg daily - she will update me via [employer]

## 2016-09-21 ENCOUNTER — Encounter: Payer: Self-pay | Admitting: Internal Medicine

## 2016-09-21 MED ORDER — ESCITALOPRAM OXALATE 20 MG PO TABS: 20.0000 mg | ORAL_TABLET | Freq: Every day | ORAL | 5 refills | Status: DC

## 2016-10-13 ENCOUNTER — Telehealth: Payer: No Typology Code available for payment source | Admitting: Family

## 2016-10-13 DIAGNOSIS — B9689 Other specified bacterial agents as the cause of diseases classified elsewhere: Secondary | ICD-10-CM

## 2016-10-13 DIAGNOSIS — J329 Chronic sinusitis, unspecified: Secondary | ICD-10-CM

## 2016-10-13 MED ORDER — AMOXICILLIN-POT CLAVULANATE 875-125 MG PO TABS: 1.0000 | ORAL_TABLET | Freq: Two times a day (BID) | ORAL | 0 refills | Status: AC

## 2016-10-13 NOTE — Progress Notes (Signed)

## 2016-11-22 ENCOUNTER — Encounter: Payer: Self-pay | Admitting: Adult Health

## 2016-11-22 ENCOUNTER — Telehealth: Payer: No Typology Code available for payment source | Admitting: Family

## 2016-11-22 ENCOUNTER — Ambulatory Visit (INDEPENDENT_AMBULATORY_CARE_PROVIDER_SITE_OTHER): Payer: No Typology Code available for payment source | Admitting: Adult Health

## 2016-11-22 VITALS — BP 128/70 | Temp 98.7°F | Ht 66.0 in | Wt 153.6 lb

## 2016-11-22 DIAGNOSIS — J069 Acute upper respiratory infection, unspecified: Secondary | ICD-10-CM | POA: Diagnosis not present

## 2016-11-22 DIAGNOSIS — R519 Headache, unspecified: Secondary | ICD-10-CM

## 2016-11-22 DIAGNOSIS — R51 Headache: Secondary | ICD-10-CM | POA: Diagnosis not present

## 2016-11-22 DIAGNOSIS — B9789 Other viral agents as the cause of diseases classified elsewhere: Secondary | ICD-10-CM

## 2016-11-22 DIAGNOSIS — J329 Chronic sinusitis, unspecified: Secondary | ICD-10-CM

## 2016-11-22 LAB — POCT INFLUENZA A/B
INFLUENZA A, POC: NEGATIVE
INFLUENZA B, POC: NEGATIVE

## 2016-11-22 MED ORDER — HYDROCODONE-HOMATROPINE 5-1.5 MG/5ML PO SYRP: 5.0000 mL | ORAL_SOLUTION | Freq: Four times a day (QID) | ORAL | 0 refills | Status: DC | PRN

## 2016-11-22 MED ORDER — PREDNISONE 10 MG PO TABS: ORAL_TABLET | ORAL | 0 refills | Status: DC

## 2016-11-22 MED ORDER — FLUTICASONE PROPIONATE 50 MCG/ACT NA SUSP: NASAL | 8 refills | Status: DC

## 2016-11-22 NOTE — Progress Notes (Signed)

## 2016-11-22 NOTE — Progress Notes (Signed)
Subjective:    Patient ID: Angelica Patton, female    DOB: 20-May-1951, 66 y.o.   MRN: [id number]  HPI  66 year old female who  has a past medical history of Anxiety; Breast cancer ([place]); Depression; Hypertension; Osteoporosis; and STD (sexually transmitted disease). She presents to the office today with 4 days of semi productive cough, mostly dry in nature, sinus pain and pressure and headache. She reports that the cough is constant and the headache is described as a "sharp stabbing pain on the side of her temples." She denies any scalp tenderness  She denies any n/v/d   Has not had nay fevers but feels as though she is chilled.   Started using Flonase this morning as well as Mucinex.    Review of Systems  Constitutional: Positive for activity change, chills and fatigue. Negative for diaphoresis and fever.  HENT: Positive for rhinorrhea, sinus pain and sinus pressure. Negative for ear pain, sore throat and tinnitus.   Eyes: Negative.   Respiratory: Positive for cough. Negative for chest tightness and wheezing.   Cardiovascular: Negative.   Gastrointestinal: Negative.   Musculoskeletal: Negative.   Skin: Negative.   Neurological: Positive for headaches. Negative for dizziness, seizures, weakness, light-headedness and numbness.   Past Medical History:  Diagnosis Date  . Anxiety   . Breast cancer [facility])    s/p double mastectomy 01/2010  . Depression   . Hypertension   . Osteoporosis   . STD (sexually transmitted disease)    Herpes    Social History   Social History  . Marital status: Married    Spouse name: N/A  . Number of children: N/A  . Years of education: N/A   Occupational History  . mortgage lender    Social History Main Topics  . Smoking status: Former Smoker    Years: 15.00    Types: Cigarettes    Quit date: 12/06/2012  . Smokeless tobacco: Never Used     Comment: quit over a year ago  . Alcohol use Yes     Comment: moderate-wine 5 glasses per weeks  .  Drug use: No  . Sexual activity: Yes    Partners: Male    Birth control/ protection: Post-menopausal   Other Topics Concern  . Not on file   Social History Narrative  . No narrative on file    Past Surgical History:  Procedure Laterality Date  . COLPOSCOPY    . cone procedure  2010   unsure about date  . double mastectomy    . KNEE SURGERY    . MASTECTOMY  01/25/2010   Bilateral     Family History  Problem Relation Age of Onset  . Ulcers Father   . Alcohol abuse Father   . Heart failure Mother   . Alcohol abuse Mother   . Heart disease Mother   . Depression Sister   . Depression Daughter   . Cancer Paternal Grandmother     No Known Allergies  Current Outpatient Prescriptions on File Prior to Visit  Medication Sig Dispense Refill  . ALPRAZolam (XANAX) 0.25 MG tablet Take 1 tablet (0.25 mg total) by mouth at bedtime as needed for anxiety. 7 tablet 0  . aspirin 81 MG tablet Take 81 mg by mouth daily.    Marland Kitchen azelastine (ASTELIN) 0.1 % nasal spray PLACE 1 PUFF IN EACH NOSTRIL TWICE A DAY  5  . cholecalciferol (VITAMIN D) 1000 UNITS tablet Take 1,000 Units by mouth daily.    Marland Kitchen  escitalopram (LEXAPRO) 20 MG tablet Take 1 tablet (20 mg total) by mouth daily. 30 tablet 5  . fluticasone (FLONASE) 50 MCG/ACT nasal spray SPRAY ONCE IN EACH NOSTRIL EVERY DAY 16 g 8  . levocetirizine (XYZAL) 5 MG tablet Take 5 mg by mouth every evening.  5  . montelukast (SINGULAIR) 10 MG tablet Take 10 mg by mouth every evening.  5  . Multiple Vitamins-Minerals (MULTIVITAMIN PO) Take by mouth.    Marland Kitchen olopatadine (PATANOL) 0.1 % ophthalmic solution PLACE 1 DROP INTO AFFECTED EYE TWICE A DAY  5  . Probiotic Product (ACIDOPHILUS/GOAT MILK) CAPS Take by mouth.    . valACYclovir (VALTREX) 500 MG tablet Take 1 tablet (500 mg total) by mouth as needed. 30 tablet 3   No current facility-administered medications on file prior to visit.     BP 128/70   Temp 98.7 F (37.1 C) (Oral)   Ht 5\' 6"  (1.676 m)    Wt 153 lb 9.6 oz (69.7 kg)   BMI 24.79 kg/m       Objective:   Physical Exam  Constitutional: She is oriented to person, place, and time. She appears well-developed and well-nourished. No distress.  HENT:  Head: Normocephalic and atraumatic.  Right Ear: Hearing, tympanic membrane, external ear and ear canal normal.  Left Ear: Hearing, tympanic membrane, external ear and ear canal normal.  Nose: Rhinorrhea present. Right sinus exhibits no maxillary sinus tenderness and no frontal sinus tenderness. Left sinus exhibits no maxillary sinus tenderness and no frontal sinus tenderness.  Mouth/Throat: Uvula is midline, oropharynx is clear and moist and mucous membranes are normal. No oropharyngeal exudate.  Eyes: Conjunctivae and EOM are normal. Pupils are equal, round, and reactive to light. Right eye exhibits no discharge. Left eye exhibits no discharge. No scleral icterus.  Neck: Normal range of motion. Neck supple. No JVD present. No spinous process tenderness and no muscular tenderness present. No neck rigidity. No tracheal deviation and normal range of motion present. No Brudzinski's sign and no Kernig's sign noted. No thyromegaly present.  Cardiovascular: Normal rate, regular rhythm, normal heart sounds and intact distal pulses.  Exam reveals no gallop and no friction rub.   No murmur heard. Pulmonary/Chest: Effort normal and breath sounds normal. No stridor. No respiratory distress. She has no wheezes. She has no rales. She exhibits no tenderness.  Abdominal: Soft. Bowel sounds are normal. She exhibits no distension. There is no tenderness. There is no rebound and no guarding.  Musculoskeletal: She exhibits no edema, tenderness or deformity.  No scalp tenderness with palpation. She will have periods throughout exam where she will grab her head in pain.   Lymphadenopathy:    She has no cervical adenopathy.  Neurological: She is alert and oriented to person, place, and time. No cranial nerve  deficit. Coordination normal.  Skin: Skin is warm and dry. No rash noted. She is not diaphoretic. No erythema. No pallor.  Psychiatric: She has a normal mood and affect. Her behavior is normal. Judgment and thought content normal.  Nursing note and vitals reviewed.      Assessment & Plan:  1. Upper respiratory tract infection, unspecified type - Appears viral in nature. Continue with Flonase and Mucinex  - predniSONE (DELTASONE) 10 MG tablet; 40 mg x 3 days, 20 mg x 3 days, 10 mg x 3 days  Dispense: 21 tablet; Refill: 0 - HYDROcodone-homatropine (HYCODAN) 5-1.5 MG/5ML syrup; Take 5 mLs by mouth every 6 (six) hours as needed for cough.  Dispense: 120 mL; Refill: 0 - Stay hydrated and rest - Can call back in 3-4 days if no improvement. Follow up with myself or PCP if no improvement  2. Acute intractable headache, unspecified headache type - Will get labs to r/o temporarteritis although unlikely. She denies thunderclap headache and does not have any signs of meningitis. Advised that if she continues to have a headache then she needs to go to the ER - CRP, High Sensitivity - Sedimentation Rate - CBC with Differential/Platelet - POC Influenza A/B   Dorothyann Peng, NP

## 2016-11-23 LAB — SEDIMENTATION RATE: SED RATE: 3 mm/h (ref 0–30)

## 2016-11-23 LAB — CBC WITH DIFFERENTIAL/PLATELET
Basophils Absolute: 0 10*3/uL (ref 0.0–0.1)
Basophils Relative: 0.5 % (ref 0.0–3.0)
EOS ABS: 0 10*3/uL (ref 0.0–0.7)
EOS PCT: 0.3 % (ref 0.0–5.0)
HCT: 38.8 % (ref 36.0–46.0)
Hemoglobin: 12.9 g/dL (ref 12.0–15.0)
LYMPHS ABS: 0.7 10*3/uL (ref 0.7–4.0)
Lymphocytes Relative: 14.8 % (ref 12.0–46.0)
MCHC: 33.2 g/dL (ref 30.0–36.0)
MCV: 91.7 fl (ref 78.0–100.0)
MONO ABS: 0.8 10*3/uL (ref 0.1–1.0)
Monocytes Relative: 15.1 % — ABNORMAL HIGH (ref 3.0–12.0)
NEUTROS PCT: 69.3 % (ref 43.0–77.0)
Neutro Abs: 3.5 10*3/uL (ref 1.4–7.7)
Platelets: 163 10*3/uL (ref 150.0–400.0)
RBC: 4.23 Mil/uL (ref 3.87–5.11)
RDW: 13.2 % (ref 11.5–15.5)
WBC: 5 10*3/uL (ref 4.0–10.5)

## 2016-11-23 LAB — HIGH SENSITIVITY CRP: CRP, High Sensitivity: 18.88 mg/L — ABNORMAL HIGH (ref 0.000–5.000)

## 2016-11-24 ENCOUNTER — Telehealth: Payer: Self-pay | Admitting: Internal Medicine

## 2016-11-24 ENCOUNTER — Other Ambulatory Visit: Payer: Self-pay | Admitting: Adult Health

## 2016-11-24 MED ORDER — AZITHROMYCIN 250 MG PO TABS: ORAL_TABLET | ORAL | 0 refills | Status: DC

## 2016-11-24 NOTE — Telephone Encounter (Signed)
° ° ° °  Pt call and said she would like Cory to call her back concerning her visit on 11/22/16 She was doing better now she not.

## 2016-11-24 NOTE — Telephone Encounter (Signed)
I spoke to Angelica Patton on the phone and she informed me that she continues to have significant sinus pain and pressure. Her headaches are no over her sinus cavities and she continues to have a nonproductive cough. She denies any fevers, nausea, vomiting, diarrhea.  I will send in a z-pack   Advised to go to the Er with worsening symptoms or follow up with PCP

## 2016-11-24 NOTE — Telephone Encounter (Signed)
I contacted patient to see how she was feeling.  She states that she feels terrible today. She states that she has a lot of pressure over her eyes, pain when coughing and still feels very congested.  Patient states that she is leaving for [place] on Monday and she just wants Angelica Patton to be sure that she doesn't need an antibiotic before she goes, because she does not want to feel miserable on her trip. Please advise.

## 2016-11-26 ENCOUNTER — Telehealth: Payer: Self-pay | Admitting: Internal Medicine

## 2016-11-26 DIAGNOSIS — R7982 Elevated C-reactive protein (CRP): Secondary | ICD-10-CM

## 2016-11-26 NOTE — Telephone Encounter (Signed)
Call her - she saw Tommi Rumps last week.  One of her blood tests was elevated - I would like to recheck this -- have her come to the lab later this week.  No fasting needed. (blood work ordered)

## 2016-11-27 ENCOUNTER — Other Ambulatory Visit (INDEPENDENT_AMBULATORY_CARE_PROVIDER_SITE_OTHER): Payer: No Typology Code available for payment source

## 2016-11-27 ENCOUNTER — Encounter: Payer: Self-pay | Admitting: Internal Medicine

## 2016-11-27 DIAGNOSIS — R7982 Elevated C-reactive protein (CRP): Secondary | ICD-10-CM

## 2016-11-27 LAB — C-REACTIVE PROTEIN: CRP: 0.2 mg/dL — AB (ref 0.5–20.0)

## 2016-11-27 NOTE — Telephone Encounter (Signed)
LVM informing pt

## 2016-12-08 ENCOUNTER — Encounter: Payer: Self-pay | Admitting: Internal Medicine

## 2016-12-13 MED ORDER — AMOXICILLIN-POT CLAVULANATE 875-125 MG PO TABS: 1.0000 | ORAL_TABLET | Freq: Two times a day (BID) | ORAL | 0 refills | Status: DC

## 2017-01-03 ENCOUNTER — Ambulatory Visit (INDEPENDENT_AMBULATORY_CARE_PROVIDER_SITE_OTHER): Payer: No Typology Code available for payment source | Admitting: Internal Medicine

## 2017-01-03 ENCOUNTER — Encounter: Payer: Self-pay | Admitting: Internal Medicine

## 2017-01-03 DIAGNOSIS — J069 Acute upper respiratory infection, unspecified: Secondary | ICD-10-CM

## 2017-01-03 MED ORDER — POLYMYXIN B-TRIMETHOPRIM 10000-0.1 UNIT/ML-% OP SOLN: 1.0000 [drp] | OPHTHALMIC | 0 refills | Status: DC

## 2017-01-03 MED ORDER — PREDNISONE 10 MG PO TABS: ORAL_TABLET | ORAL | 0 refills | Status: DC

## 2017-01-03 MED ORDER — AMOXICILLIN-POT CLAVULANATE 875-125 MG PO TABS: 1.0000 | ORAL_TABLET | Freq: Two times a day (BID) | ORAL | 0 refills | Status: DC

## 2017-01-03 NOTE — Progress Notes (Signed)
Pre visit review using our clinic review tool, if applicable. No additional management support is needed unless otherwise documented below in the visit note. 

## 2017-01-03 NOTE — Patient Instructions (Signed)
Take the prendisone - take .  Take the antibiotic only if needed.

## 2017-01-03 NOTE — Assessment & Plan Note (Signed)
Likely viral  Possible sinus infection - has had good relief with prednisone - will do quick taper augmentin prescription - use only if no improvement Rest, fluids Eye drops for possible bacterial conjunctivitis - was in recent contact with granddaughter Warm compresses to eyes

## 2017-01-03 NOTE — Progress Notes (Signed)
Subjective:    Patient ID: Angelica Patton, female    DOB: [birth year], 66 y.o.   MRN: [id number]  HPI  She is here for an acute visit for cold symptoms.   Her symptoms started a few days ago.  This is the fourth time she has had these symptoms and does not know what to do.   She is experiencing chills, congestion, sinus pressure, eye itching/discharge, redness, cough that is rarely productive, body aches and headaches.  She denies fever, ear pain, sinus pain, sore throat, sob and wheeze.      Medications and allergies reviewed with patient and updated if appropriate.  Patient Active Problem List   Diagnosis Date Noted  . History of pneumonia 07/03/2016  . Anxiety 07/03/2016  . Allergic rhinitis 07/03/2016  . History of breast cancer 07/03/2016  . Osteoporosis 05/23/2016  . Hypertension   . Depression   . Breast cancer [facility])     Current Outpatient Prescriptions on File Prior to Visit  Medication Sig Dispense Refill  . ALPRAZolam (XANAX) 0.25 MG tablet Take 1 tablet (0.25 mg total) by mouth at bedtime as needed for anxiety. 7 tablet 0  . amoxicillin-clavulanate (AUGMENTIN) 875-125 MG tablet Take 1 tablet by mouth 2 (two) times daily. 20 tablet 0  . aspirin 81 MG tablet Take 81 mg by mouth daily.    Marland Kitchen azelastine (ASTELIN) 0.1 % nasal spray PLACE 1 PUFF IN EACH NOSTRIL TWICE A DAY  5  . azithromycin (ZITHROMAX Z-PAK) 250 MG tablet Take 2 tablets on Day 1.  Then take 1 tablet daily. 6 tablet 0  . cholecalciferol (VITAMIN D) 1000 UNITS tablet Take 1,000 Units by mouth daily.    Marland Kitchen escitalopram (LEXAPRO) 20 MG tablet Take 1 tablet (20 mg total) by mouth daily. 30 tablet 5  . fluticasone (FLONASE) 50 MCG/ACT nasal spray SPRAY ONCE IN EACH NOSTRIL EVERY DAY 16 g 8  . HYDROcodone-homatropine (HYCODAN) 5-1.5 MG/5ML syrup Take 5 mLs by mouth every 6 (six) hours as needed for cough. 120 mL 0  . levocetirizine (XYZAL) 5 MG tablet Take 5 mg by mouth every evening.  5  . montelukast  (SINGULAIR) 10 MG tablet Take 10 mg by mouth every evening.  5  . Multiple Vitamins-Minerals (MULTIVITAMIN PO) Take by mouth.    Marland Kitchen olopatadine (PATANOL) 0.1 % ophthalmic solution PLACE 1 DROP INTO AFFECTED EYE TWICE A DAY  5  . predniSONE (DELTASONE) 10 MG tablet 40 mg x 3 days, 20 mg x 3 days, 10 mg x 3 days 21 tablet 0  . Probiotic Product (ACIDOPHILUS/GOAT MILK) CAPS Take by mouth.    . valACYclovir (VALTREX) 500 MG tablet Take 1 tablet (500 mg total) by mouth as needed. 30 tablet 3   No current facility-administered medications on file prior to visit.     Past Medical History:  Diagnosis Date  . Anxiety   . Breast cancer [facility])    s/p double mastectomy 01/2010  . Depression   . Hypertension   . Osteoporosis   . STD (sexually transmitted disease)    Herpes    Past Surgical History:  Procedure Laterality Date  . COLPOSCOPY    . cone procedure  2010   unsure about date  . double mastectomy    . KNEE SURGERY    . MASTECTOMY  01/25/2010   Bilateral     Social History   Social History  . Marital status: Married    Spouse name: N/A  .  Number of children: N/A  . Years of education: N/A   Occupational History  . mortgage lender    Social History Main Topics  . Smoking status: Former Smoker    Years: 15.00    Types: Cigarettes    Quit date: 12/06/2012  . Smokeless tobacco: Never Used     Comment: quit over a year ago  . Alcohol use Yes     Comment: moderate-wine 5 glasses per weeks  . Drug use: No  . Sexual activity: Yes    Partners: Male    Birth control/ protection: Post-menopausal   Other Topics Concern  . Not on file   Social History Narrative  . No narrative on file    Family History  Problem Relation Age of Onset  . Ulcers Father   . Alcohol abuse Father   . Heart failure Mother   . Alcohol abuse Mother   . Heart disease Mother   . Depression Sister   . Depression Daughter   . Cancer Paternal Grandmother     Review of Systems    Constitutional: Positive for chills. Negative for fever.  HENT: Positive for congestion and sinus pressure. Negative for ear pain, sinus pain and sore throat.   Eyes: Positive for discharge, redness and itching.  Respiratory: Positive for cough (crackle in chest, discolored phlegm). Negative for shortness of breath and wheezing.   Musculoskeletal: Positive for myalgias.  Neurological: Positive for headaches.       Objective:   Vitals:   01/03/17 1616  BP: 108/78  Pulse: 85  Temp: 98.1 F (36.7 C)   Filed Weights   01/03/17 1616  Weight: 148 lb 0.6 oz (67.2 kg)   Body mass index is 23.89 kg/m.  Wt Readings from Last 3 Encounters:  01/03/17 148 lb 0.6 oz (67.2 kg)  11/22/16 153 lb 9.6 oz (69.7 kg)  09/06/16 152 lb (68.9 kg)     Physical Exam  GENERAL APPEARANCE: Appears stated age, well appearing, NAD EYES: conjunctiva clear, no icterus HEENT: bilateral tympanic membranes and ear canals normal, oropharynx with mild erythema, no thyromegaly, trachea midline, no cervical or supraclavicular lymphadenopathy LUNGS: Clear to auscultation without wheeze or crackles, unlabored breathing, good air entry bilaterally HEART: Normal S1,S2 without murmurs EXTREMITIES: Without clubbing, cyanosis, or edema       Assessment & Plan:   See Problem List for Assessment and Plan of chronic medical problems.

## 2017-01-29 ENCOUNTER — Other Ambulatory Visit: Payer: Self-pay

## 2017-01-29 NOTE — Telephone Encounter (Signed)
Medication refill request: valACYlovir 500mg  Last AEX:  03/16/16 SM Next AEX: 06/15/17 Last MMG (if hormonal medication request): S/P bilateral mastectomy 2011 Refill authorized: 03/16/16 #30 w/3 refills; today please advise

## 2017-01-30 MED ORDER — VALACYCLOVIR HCL 500 MG PO TABS: 500.0000 mg | ORAL_TABLET | ORAL | 3 refills | Status: DC | PRN

## 2017-03-03 ENCOUNTER — Other Ambulatory Visit: Payer: Self-pay | Admitting: Internal Medicine

## 2017-03-23 ENCOUNTER — Ambulatory Visit (INDEPENDENT_AMBULATORY_CARE_PROVIDER_SITE_OTHER): Payer: No Typology Code available for payment source | Admitting: Obstetrics & Gynecology

## 2017-03-23 ENCOUNTER — Other Ambulatory Visit (HOSPITAL_COMMUNITY)
Admission: RE | Admit: 2017-03-23 | Discharge: 2017-03-23 | Disposition: A | Discharge status: Home / Self Care | Payer: No Typology Code available for payment source | Source: Ambulatory Visit | Attending: Obstetrics & Gynecology | Admitting: Obstetrics & Gynecology

## 2017-03-23 ENCOUNTER — Encounter: Payer: Self-pay | Admitting: Obstetrics & Gynecology

## 2017-03-23 VITALS — BP 120/80 | HR 76 | Resp 14 | Ht 65.5 in | Wt 155.0 lb

## 2017-03-23 DIAGNOSIS — Z8619 Personal history of other infectious and parasitic diseases: Secondary | ICD-10-CM | POA: Diagnosis not present

## 2017-03-23 DIAGNOSIS — Z124 Encounter for screening for malignant neoplasm of cervix: Secondary | ICD-10-CM | POA: Diagnosis not present

## 2017-03-23 DIAGNOSIS — Z0000 Encounter for general adult medical examination without abnormal findings: Secondary | ICD-10-CM

## 2017-03-23 DIAGNOSIS — B977 Papillomavirus as the cause of diseases classified elsewhere: Secondary | ICD-10-CM | POA: Diagnosis present

## 2017-03-23 DIAGNOSIS — Z01419 Encounter for gynecological examination (general) (routine) without abnormal findings: Secondary | ICD-10-CM

## 2017-03-23 LAB — POCT URINALYSIS DIPSTICK
Bilirubin, UA: NEGATIVE
Glucose, UA: NEGATIVE
KETONES UA: NEGATIVE
Leukocytes, UA: NEGATIVE
Nitrite, UA: NEGATIVE
Protein, UA: NEGATIVE
RBC UA: NEGATIVE
UROBILINOGEN UA: 0.2 U/dL
pH, UA: 7 (ref 5.0–8.0)

## 2017-03-23 NOTE — Progress Notes (Signed)
66 y.o. [id number] MarriedCaucasianF here for annual exam.  Doing well.  Had some URI issues earlier this year.  Denies vaginal bleeding.    No LMP recorded. Patient is postmenopausal.          Sexually active: Yes.    The current method of family planning is post menopausal status.    Exercising: No.  walking, kayak Smoker:  no  Health Maintenance: Pap:  03/16/16 Neg. HR HPV:+Detected. 16/18/45: negative  10/27/13 neg  History of abnormal Pap:  yes MMG:  Bilateral mastectomy 2011 Colonoscopy:  06/15/14 normal. F/u 5 years  BMD:   04/13/16 osteoporosis  TDaP:  Current, six or seven years ago Pneumonia vaccine(s):  2012  Zostavax:   2016  Hep C testing: 08/31/16 Neg  Screening Labs: PCP, Urine today: Negative    reports that she quit smoking about 4 years ago. Her smoking use included Cigarettes. She quit after 15.00 years of use. She has never used smokeless tobacco. She reports that she drinks alcohol. She reports that she does not use drugs.  Past Medical History:  Diagnosis Date  . Anxiety   . Breast cancer [facility])    s/p double mastectomy 01/2010  . Depression   . Hypertension   . Osteoporosis   . STD (sexually transmitted disease)    Herpes    Past Surgical History:  Procedure Laterality Date  . COLPOSCOPY    . cone procedure  2010   unsure about date  . double mastectomy    . KNEE SURGERY    . MASTECTOMY  01/25/2010   Bilateral     Current Outpatient Prescriptions  Medication Sig Dispense Refill  . ALPRAZolam (XANAX) 0.25 MG tablet Take 1 tablet (0.25 mg total) by mouth at bedtime as needed for anxiety. 7 tablet 0  . aspirin 81 MG tablet Take 81 mg by mouth daily.    Marland Kitchen azelastine (ASTELIN) 0.1 % nasal spray PLACE 1 PUFF IN EACH NOSTRIL TWICE A DAY  5  . cholecalciferol (VITAMIN D) 1000 UNITS tablet Take 1,000 Units by mouth daily.    Marland Kitchen escitalopram (LEXAPRO) 20 MG tablet TAKE 1 TABLET (20 MG TOTAL) BY MOUTH DAILY. 30 tablet 5  . fluticasone (FLONASE) 50 MCG/ACT nasal  spray SPRAY ONCE IN EACH NOSTRIL EVERY DAY 16 g 8  . levocetirizine (XYZAL) 5 MG tablet Take 5 mg by mouth every evening.  5  . montelukast (SINGULAIR) 10 MG tablet Take 10 mg by mouth every evening.  5  . Multiple Vitamins-Minerals (MULTIVITAMIN PO) Take by mouth.    Marland Kitchen olopatadine (PATANOL) 0.1 % ophthalmic solution PLACE 1 DROP INTO AFFECTED EYE TWICE A DAY  5  . Probiotic Product (ACIDOPHILUS/GOAT MILK) CAPS Take by mouth.    . valACYclovir (VALTREX) 500 MG tablet Take 1 tablet (500 mg total) by mouth as needed. 30 tablet 3   No current facility-administered medications for this visit.     Family History  Problem Relation Age of Onset  . Ulcers Father   . Alcohol abuse Father   . Heart failure Mother   . Alcohol abuse Mother   . Heart disease Mother   . Depression Sister   . Depression Daughter   . Cancer Paternal Grandmother     ROS:  Pertinent items are noted in HPI.  Otherwise, a comprehensive ROS was negative.  Exam:   BP 120/80 (BP Location: Right Arm, Patient Position: Sitting, Cuff Size: Normal)   Pulse 76   Resp 14  Ht 5' 5.5" (1.664 m)   Wt 155 lb (70.3 kg)   BMI 25.40 kg/m      Height: 5' 5.5" (166.4 cm)  Ht Readings from Last 3 Encounters:  03/23/17 5' 5.5" (1.664 m)  01/03/17 5\' 6"  (1.676 m)  11/22/16 5\' 6"  (1.676 m)    General appearance: alert, cooperative and appears stated age Head: Normocephalic, without obvious abnormality, atraumatic Neck: no adenopathy, supple, symmetrical, trachea midline and thyroid normal to inspection and palpation Lungs: clear to auscultation bilaterally Breasts: bilateral implants with reconstruction, no masses Heart: regular rate and rhythm Abdomen: soft, non-tender; bowel sounds normal; no masses,  no organomegaly Extremities: extremities normal, atraumatic, no cyanosis or edema Skin: Skin color, texture, turgor normal. No rashes or lesions Lymph nodes: Cervical, supraclavicular, and axillary nodes normal. No abnormal  inguinal nodes palpated Neurologic: Grossly normal   Pelvic: External genitalia:  no lesions              Urethra:  normal appearing urethra with no masses, tenderness or lesions              Bartholins and Skenes: normal                 Vagina: normal appearing vagina with normal color and discharge, no lesions              Cervix: no lesions              Pap taken: Yes.   Bimanual Exam:  Uterus:  normal size, contour, position, consistency, mobility, non-tender              Adnexa: normal adnexa and no mass, fullness, tenderness               Rectovaginal: Confirms               Anus:  normal sphincter tone, no lesions  Chaperone was present for exam.  A:  Well Woman with normal exam H/o breast cancer, s/p bilateral mastectomy.  Did not need chemo or radiation.  Has undergone reconstruction. Osteoporosis, last 8/17.  Declined treatment at that time.  Willing to do repeat BMD next year.   H/o abnormal pap smears with conization >10 years ago, +HR HPV 2017  P:   Mammogram guidelines reviewed pap smear with HR HPV obtained today BMD planned for 8/19 Valtrex rx not needed.  Pt will call if/when rx needed.   Shingrix vaccination order given return annually or prn

## 2017-03-27 LAB — CYTOLOGY - PAP
DIAGNOSIS: NEGATIVE
HPV: NOT DETECTED

## 2017-06-06 ENCOUNTER — Other Ambulatory Visit: Payer: Self-pay | Admitting: Internal Medicine

## 2017-06-06 NOTE — Telephone Encounter (Signed)
RX faxed to [facility] 

## 2017-06-06 NOTE — Telephone Encounter (Signed)
Ok to fill. printed 

## 2017-06-06 NOTE — Telephone Encounter (Signed)
Unable to find on Controlled substance database, Per chart last fill Oct 2017

## 2017-06-15 ENCOUNTER — Ambulatory Visit: Payer: PRIVATE HEALTH INSURANCE | Admitting: Obstetrics & Gynecology

## 2017-07-14 ENCOUNTER — Other Ambulatory Visit: Payer: Self-pay | Admitting: Internal Medicine

## 2017-07-16 ENCOUNTER — Ambulatory Visit: Payer: No Typology Code available for payment source

## 2017-07-16 NOTE — Telephone Encounter (Signed)
RX faxed to [facility] 

## 2017-07-16 NOTE — Telephone Encounter (Signed)
Last RX sent 06/06/17. Not showing in controlled database. Please advise.

## 2017-07-18 ENCOUNTER — Ambulatory Visit: Payer: No Typology Code available for payment source

## 2017-08-09 ENCOUNTER — Encounter: Payer: Self-pay | Admitting: Internal Medicine

## 2017-08-20 ENCOUNTER — Ambulatory Visit: Payer: No Typology Code available for payment source | Admitting: Internal Medicine

## 2017-09-03 NOTE — Progress Notes (Deleted)
Subjective:    Patient ID: Angelica Patton, female    DOB: 1950/10/26, 66 y.o.   MRN: [id number]  HPI She is here for an acute visit.    Stomach issues:      Medications and allergies reviewed with patient and updated if appropriate.  Patient Active Problem List   Diagnosis Date Noted  . Upper respiratory tract infection 01/03/2017  . History of pneumonia 07/03/2016  . Anxiety 07/03/2016  . Allergic rhinitis 07/03/2016  . History of breast cancer 07/03/2016  . Osteoporosis 05/23/2016  . Hypertension   . Depression   . Breast cancer [facility])     Current Outpatient Medications on File Prior to Visit  Medication Sig Dispense Refill  . ALPRAZolam (XANAX) 0.25 MG tablet TAKE 1 TABLET BY MOUTH AT BEDTIME AS NEEDED FOR ANXIETY 30 tablet 0  . aspirin 81 MG tablet Take 81 mg by mouth daily.    Marland Kitchen azelastine (ASTELIN) 0.1 % nasal spray PLACE 1 PUFF IN EACH NOSTRIL TWICE A DAY  5  . cholecalciferol (VITAMIN D) 1000 UNITS tablet Take 1,000 Units by mouth daily.    Marland Kitchen escitalopram (LEXAPRO) 20 MG tablet TAKE 1 TABLET (20 MG TOTAL) BY MOUTH DAILY. 30 tablet 5  . fluticasone (FLONASE) 50 MCG/ACT nasal spray SPRAY ONCE IN EACH NOSTRIL EVERY DAY 16 g 8  . levocetirizine (XYZAL) 5 MG tablet Take 5 mg by mouth every evening.  5  . montelukast (SINGULAIR) 10 MG tablet Take 10 mg by mouth every evening.  5  . Multiple Vitamins-Minerals (MULTIVITAMIN PO) Take by mouth.    Marland Kitchen olopatadine (PATANOL) 0.1 % ophthalmic solution PLACE 1 DROP INTO AFFECTED EYE TWICE A DAY  5  . Probiotic Product (ACIDOPHILUS/GOAT MILK) CAPS Take by mouth.    . valACYclovir (VALTREX) 500 MG tablet Take 1 tablet (500 mg total) by mouth as needed. 30 tablet 3   No current facility-administered medications on file prior to visit.     Past Medical History:  Diagnosis Date  . Anxiety   . Breast cancer [facility])    s/p double mastectomy 01/2010  . Depression   . Hypertension   . Osteoporosis   . STD (sexually transmitted  disease)    Herpes    Past Surgical History:  Procedure Laterality Date  . COLPOSCOPY    . cone procedure  2010   unsure about date  . double mastectomy    . KNEE SURGERY    . MASTECTOMY  01/25/2010   Bilateral     Social History   Socioeconomic History  . Marital status: Married    Spouse name: Not on file  . Number of children: Not on file  . Years of education: Not on file  . Highest education level: Not on file  Social Needs  . Financial resource strain: Not on file  . Food insecurity - worry: Not on file  . Food insecurity - inability: Not on file  . Transportation needs - medical: Not on file  . Transportation needs - non-medical: Not on file  Occupational History  . Occupation: Cytogeneticist  Tobacco Use  . Smoking status: Former Smoker    Years: 15.00    Types: Cigarettes    Last attempt to quit: 12/06/2012    Years since quitting: 4.7  . Smokeless tobacco: Never Used  . Tobacco comment: quit over a year ago  Substance and Sexual Activity  . Alcohol use: Yes    Comment: moderate-wine 5 glasses per  weeks  . Drug use: No  . Sexual activity: Yes    Partners: Male    Birth control/protection: Post-menopausal  Other Topics Concern  . Not on file  Social History Narrative  . Not on file    Family History  Problem Relation Age of Onset  . Ulcers Father   . Alcohol abuse Father   . Heart failure Mother   . Alcohol abuse Mother   . Heart disease Mother   . Depression Sister   . Depression Daughter   . Cancer Paternal Grandmother     Review of Systems     Objective:  There were no vitals filed for this visit. Wt Readings from Last 3 Encounters:  03/23/17 155 lb (70.3 kg)  01/03/17 148 lb 0.6 oz (67.2 kg)  11/22/16 153 lb 9.6 oz (69.7 kg)   There is no height or weight on file to calculate BMI.   Physical Exam    Constitutional: Appears well-developed and well-nourished. No distress.  HENT:  Head: Normocephalic and atraumatic.  Neck: Neck  supple. No tracheal deviation present. No thyromegaly present.  No cervical lymphadenopathy Cardiovascular: Normal rate, regular rhythm and normal heart sounds.   No murmur heard. No carotid bruit .  No edema Pulmonary/Chest: Effort normal and breath sounds normal. No respiratory distress. No has no wheezes. No rales.  Abdomen:  Soft, non tender, non distended Skin: Skin is warm and dry. Not diaphoretic.  Psychiatric: Normal mood and affect. Behavior is normal.      Assessment & Plan:    See Problem List for Assessment and Plan of chronic medical problems.

## 2017-09-04 ENCOUNTER — Ambulatory Visit: Payer: No Typology Code available for payment source | Admitting: Internal Medicine

## 2017-09-06 NOTE — Progress Notes (Signed)
Subjective:    Patient ID: Angelica Patton, female    DOB: 01-17-1951, 66 y.o.   MRN: [id number]  HPI She is here for an acute visit.    Stomach issues:  She was having uncontrollable diarrhea.  She is unsure why.  She wonders if it is sugars in a large amount or some other foods were causing it.  She has started a raw probiotic and it has helped.  She has one BM every day and they are normal.  She does have a history of IBS when she was younger, but this was primarily constipation.  Overall she feels her symptoms are currently well controlled and she does not feel that she needs further gastrointestinal evaluation.  Her colonoscopy is up-to-date and due in a year and a half.  She denies any fevers, chills, abdominal pain, nausea or blood in the stool.    Medications and allergies reviewed with patient and updated if appropriate.  Patient Active Problem List   Diagnosis Date Noted  . Upper respiratory tract infection 01/03/2017  . History of pneumonia 07/03/2016  . Anxiety 07/03/2016  . Allergic rhinitis 07/03/2016  . History of breast cancer 07/03/2016  . Osteoporosis 05/23/2016  . Hypertension   . Depression   . Breast cancer [facility])     Current Outpatient Medications on File Prior to Visit  Medication Sig Dispense Refill  . ALPRAZolam (XANAX) 0.25 MG tablet TAKE 1 TABLET BY MOUTH AT BEDTIME AS NEEDED FOR ANXIETY 30 tablet 0  . aspirin 81 MG tablet Take 81 mg by mouth daily.    Marland Kitchen azelastine (ASTELIN) 0.1 % nasal spray PLACE 1 PUFF IN EACH NOSTRIL TWICE A DAY  5  . cholecalciferol (VITAMIN D) 1000 UNITS tablet Take 1,000 Units by mouth daily.    Marland Kitchen escitalopram (LEXAPRO) 20 MG tablet TAKE 1 TABLET (20 MG TOTAL) BY MOUTH DAILY. 30 tablet 5  . fluticasone (FLONASE) 50 MCG/ACT nasal spray SPRAY ONCE IN EACH NOSTRIL EVERY DAY 16 g 8  . levocetirizine (XYZAL) 5 MG tablet Take 5 mg by mouth every evening.  5  . montelukast (SINGULAIR) 10 MG tablet Take 10 mg by mouth  every evening.  5  . Multiple Vitamins-Minerals (MULTIVITAMIN PO) Take by mouth.    Marland Kitchen olopatadine (PATANOL) 0.1 % ophthalmic solution PLACE 1 DROP INTO AFFECTED EYE TWICE A DAY  5  . Probiotic Product (ACIDOPHILUS/GOAT MILK) CAPS Take by mouth.    . valACYclovir (VALTREX) 500 MG tablet Take 1 tablet (500 mg total) by mouth as needed. 30 tablet 3   No current facility-administered medications on file prior to visit.     Past Medical History:  Diagnosis Date  . Anxiety   . Breast cancer [facility])    s/p double mastectomy 01/2010  . Depression   . Hypertension   . Osteoporosis   . STD (sexually transmitted disease)    Herpes    Past Surgical History:  Procedure Laterality Date  . COLPOSCOPY    . cone procedure  2010   unsure about date  . double mastectomy    . KNEE SURGERY    . MASTECTOMY  01/25/2010   Bilateral     Social History   Socioeconomic History  . Marital status: Married    Spouse name: None  . Number of children: None  . Years of education: None  . Highest education level: None  Social Needs  . Financial resource strain: None  . Food insecurity - worry: None  .  Food insecurity - inability: None  . Transportation needs - medical: None  . Transportation needs - non-medical: None  Occupational History  . Occupation: Cytogeneticist  Tobacco Use  . Smoking status: Former Smoker    Years: 15.00    Types: Cigarettes    Last attempt to quit: 12/06/2012    Years since quitting: 4.7  . Smokeless tobacco: Never Used  . Tobacco comment: quit over a year ago  Substance and Sexual Activity  . Alcohol use: Yes    Comment: moderate-wine 5 glasses per weeks  . Drug use: No  . Sexual activity: Yes    Partners: Male    Birth control/protection: Post-menopausal  Other Topics Concern  . None  Social History Narrative  . None    Family History  Problem Relation Age of Onset  . Ulcers Father   . Alcohol abuse Father   . Heart failure Mother   . Alcohol abuse  Mother   . Heart disease Mother   . Depression Sister   . Depression Daughter   . Cancer Paternal Grandmother     Review of Systems  Constitutional: Negative for chills and fever.  Gastrointestinal: Negative for abdominal pain, blood in stool, constipation, diarrhea and nausea.       Objective:   Vitals:   09/07/17 1040  BP: 120/74  Pulse: 93  Resp: 16  Temp: 98.6 F (37 C)  SpO2: 97%   Wt Readings from Last 3 Encounters:  03/23/17 155 lb (70.3 kg)  01/03/17 148 lb 0.6 oz (67.2 kg)  11/22/16 153 lb 9.6 oz (69.7 kg)   There is no height or weight on file to calculate BMI.   Physical Exam  Constitutional: She appears well-developed and well-nourished. No distress.  Abdominal: Soft. She exhibits no distension and no mass. There is no tenderness. There is no rebound and no guarding.  Skin: Skin is warm and dry. She is not diaphoretic.           Assessment & Plan:    See Problem List for Assessment and Plan of chronic medical problems.

## 2017-09-07 ENCOUNTER — Ambulatory Visit (INDEPENDENT_AMBULATORY_CARE_PROVIDER_SITE_OTHER): Payer: No Typology Code available for payment source | Admitting: Internal Medicine

## 2017-09-07 ENCOUNTER — Encounter: Payer: Self-pay | Admitting: Internal Medicine

## 2017-09-07 VITALS — BP 120/74 | HR 93 | Temp 98.6°F | Resp 16

## 2017-09-07 DIAGNOSIS — Z23 Encounter for immunization: Secondary | ICD-10-CM | POA: Diagnosis not present

## 2017-09-07 DIAGNOSIS — R197 Diarrhea, unspecified: Secondary | ICD-10-CM

## 2017-09-07 MED ORDER — ZOSTER VAC RECOMB ADJUVANTED 50 MCG/0.5ML IM SUSR: 0.5000 mL | Freq: Once | INTRAMUSCULAR | 1 refills | Status: AC

## 2017-09-07 NOTE — Assessment & Plan Note (Signed)
Resolved/controlled with probiotics Colonoscopy up-to-date No concerning symptoms Continue probiotics-she will let me know if symptoms resume

## 2017-09-07 NOTE — Patient Instructions (Signed)
Continue the probiotic.  Call if your symptoms recur.    Flu immunization administered today.    We have sent a prescription for the shingles vaccine to [facility] outpatient pharmacy on [address] [facility].      Have the first pneumonia vaccine in March after your birthday - you can schedule a nurse visit for that.

## 2017-10-24 ENCOUNTER — Other Ambulatory Visit: Payer: Self-pay | Admitting: Internal Medicine

## 2017-12-25 ENCOUNTER — Other Ambulatory Visit: Payer: Self-pay | Admitting: Internal Medicine

## 2018-01-11 ENCOUNTER — Other Ambulatory Visit: Payer: Self-pay | Admitting: Internal Medicine

## 2018-01-14 ENCOUNTER — Telehealth: Payer: Self-pay | Admitting: Emergency Medicine

## 2018-01-14 MED ORDER — ESCITALOPRAM OXALATE 20 MG PO TABS: 20.0000 mg | ORAL_TABLET | Freq: Every day | ORAL | 0 refills | Status: DC

## 2018-01-14 NOTE — Telephone Encounter (Signed)
Copied from [place] [id number]. Topic: General - Other >> Jan 14, 2018  9:58 AM Carolyn Stare wrote: The below med was refused because pt need an appt and she made one for Monday 01/21/18 but is asking if a weeks worth of the below med can be called in   escitalopram (LEXAPRO) 20 MG tablet  [address]

## 2018-01-19 NOTE — Progress Notes (Signed)
Subjective:    Patient ID: Angelica Patton, female    DOB: March 18, 1951, 67 y.o.   MRN: [id number]  HPI The patient is here for follow up.  Overall she feels well and has no concerns.  She is here because she needs refill of her medication.  Depression: She is taking her medication daily as prescribed. She denies any side effects from the medication. She feels her depression is well controlled and she is happy with her current dose of medication.   Anxiety: She is taking her lexapro daily as prescribed. She takes xanax only as needed, which is rare.  She denies any side effects from the medication. She feels her anxiety is well controlled and she is happy with her current dose of medication.    Medications and allergies reviewed with patient and updated if appropriate.  Patient Active Problem List   Diagnosis Date Noted  . Diarrhea 09/07/2017  . History of pneumonia 07/03/2016  . Anxiety 07/03/2016  . Allergic rhinitis 07/03/2016  . History of breast cancer 07/03/2016  . Osteoporosis 05/23/2016  . Hypertension   . Depression   . Breast cancer [facility])     Current Outpatient Medications on File Prior to Visit  Medication Sig Dispense Refill  . ALPRAZolam (XANAX) 0.25 MG tablet TAKE 1 TABLET BY MOUTH AT BEDTIME AS NEEDED FOR ANXIETY 30 tablet 0  . aspirin 81 MG tablet Take 81 mg by mouth daily.    Marland Kitchen azelastine (ASTELIN) 0.1 % nasal spray PLACE 1 PUFF IN EACH NOSTRIL TWICE A DAY  5  . cholecalciferol (VITAMIN D) 1000 UNITS tablet Take 1,000 Units by mouth daily.    Marland Kitchen escitalopram (LEXAPRO) 20 MG tablet Take 1 tablet (20 mg total) by mouth daily. 30 tablet 0  . fluticasone (FLONASE) 50 MCG/ACT nasal spray SPRAY ONCE IN EACH NOSTRIL EVERY DAY 16 g 8  . levocetirizine (XYZAL) 5 MG tablet Take 5 mg by mouth every evening.  5  . montelukast (SINGULAIR) 10 MG tablet Take 10 mg by mouth every evening.  5  . Multiple Vitamins-Minerals (MULTIVITAMIN PO) Take by mouth.    Marland Kitchen olopatadine  (PATANOL) 0.1 % ophthalmic solution PLACE 1 DROP INTO AFFECTED EYE TWICE A DAY  5  . Probiotic Product (ACIDOPHILUS/GOAT MILK) CAPS Take by mouth.    . valACYclovir (VALTREX) 500 MG tablet Take 1 tablet (500 mg total) by mouth as needed. 30 tablet 3   No current facility-administered medications on file prior to visit.     Past Medical History:  Diagnosis Date  . Anxiety   . Breast cancer [facility])    s/p double mastectomy 01/2010  . Depression   . Hypertension   . Osteoporosis   . STD (sexually transmitted disease)    Herpes    Past Surgical History:  Procedure Laterality Date  . COLPOSCOPY    . cone procedure  2010   unsure about date  . double mastectomy    . KNEE SURGERY    . MASTECTOMY  01/25/2010   Bilateral     Social History   Socioeconomic History  . Marital status: Married    Spouse name: Not on file  . Number of children: Not on file  . Years of education: Not on file  . Highest education level: Not on file  Occupational History  . Occupation: Cytogeneticist  Social Needs  . Financial resource strain: Not on file  . Food insecurity:    Worry: Not on file  Inability: Not on file  . Transportation needs:    Medical: Not on file    Non-medical: Not on file  Tobacco Use  . Smoking status: Former Smoker    Years: 15.00    Types: Cigarettes    Last attempt to quit: 12/06/2012    Years since quitting: 5.1  . Smokeless tobacco: Never Used  . Tobacco comment: quit over a year ago  Substance and Sexual Activity  . Alcohol use: Yes    Comment: moderate-wine 5 glasses per weeks  . Drug use: No  . Sexual activity: Yes    Partners: Male    Birth control/protection: Post-menopausal  Lifestyle  . Physical activity:    Days per week: Not on file    Minutes per session: Not on file  . Stress: Not on file  Relationships  . Social connections:    Talks on phone: Not on file    Gets together: Not on file    Attends religious service: Not on file    Active  member of club or organization: Not on file    Attends meetings of clubs or organizations: Not on file    Relationship status: Not on file  Other Topics Concern  . Not on file  Social History Narrative  . Not on file    Family History  Problem Relation Age of Onset  . Ulcers Father   . Alcohol abuse Father   . Heart failure Mother   . Alcohol abuse Mother   . Heart disease Mother   . Depression Sister   . Depression Daughter   . Cancer Paternal Grandmother     Review of Systems  Constitutional: Negative for chills and fever.  Respiratory: Negative for cough, shortness of breath and wheezing.   Cardiovascular: Negative for chest pain, palpitations and leg swelling.  Neurological: Negative for light-headedness and headaches.       Objective:   Vitals:   01/21/18 1447  BP: 126/84  Pulse: 71  Resp: 16  Temp: 98.1 F (36.7 C)  SpO2: 98%   BP Readings from Last 3 Encounters:  01/21/18 126/84  09/07/17 120/74  03/23/17 120/80   Wt Readings from Last 3 Encounters:  01/21/18 154 lb (69.9 kg)  03/23/17 155 lb (70.3 kg)  01/03/17 148 lb 0.6 oz (67.2 kg)   Body mass index is 25.24 kg/m.   Physical Exam    Constitutional: Appears well-developed and well-nourished. No distress.  HENT:  Head: Normocephalic and atraumatic.  Neck: Neck supple. No tracheal deviation present. No thyromegaly present.  No cervical lymphadenopathy Cardiovascular: Normal rate, regular rhythm and normal heart sounds.   No murmur heard. No carotid bruit .  No edema Pulmonary/Chest: Effort normal and breath sounds normal. No respiratory distress. No has no wheezes. No rales.  Skin: Skin is warm and dry. Not diaphoretic.  Psychiatric: Normal mood and affect. Behavior is normal.      Assessment & Plan:    See Problem List for Assessment and Plan of chronic medical problems.    FU annually

## 2018-01-21 ENCOUNTER — Encounter: Payer: Self-pay | Admitting: Internal Medicine

## 2018-01-21 ENCOUNTER — Ambulatory Visit (INDEPENDENT_AMBULATORY_CARE_PROVIDER_SITE_OTHER): Payer: No Typology Code available for payment source | Admitting: Internal Medicine

## 2018-01-21 VITALS — BP 126/84 | HR 71 | Temp 98.1°F | Resp 16 | Ht 65.5 in | Wt 154.0 lb

## 2018-01-21 DIAGNOSIS — F419 Anxiety disorder, unspecified: Secondary | ICD-10-CM | POA: Diagnosis not present

## 2018-01-21 DIAGNOSIS — Z23 Encounter for immunization: Secondary | ICD-10-CM

## 2018-01-21 DIAGNOSIS — F329 Major depressive disorder, single episode, unspecified: Secondary | ICD-10-CM

## 2018-01-21 DIAGNOSIS — F32A Depression, unspecified: Secondary | ICD-10-CM

## 2018-01-21 MED ORDER — ESCITALOPRAM OXALATE 20 MG PO TABS: 20.0000 mg | ORAL_TABLET | Freq: Every day | ORAL | 3 refills | Status: DC

## 2018-01-21 NOTE — Assessment & Plan Note (Signed)
Taking lexapro daily Controlled Rarely takes xanax

## 2018-01-21 NOTE — Assessment & Plan Note (Signed)
Controlled, stable Continue current dose of medication FU annually

## 2018-01-21 NOTE — Patient Instructions (Addendum)
  All other Health Maintenance issues reviewed.   All recommended immunizations and age-appropriate screenings are up-to-date or discussed.  prevnar immunization administered today.   Medications reviewed and updated.  No changes recommended at this time.  Your prescription(s) have been submitted to your pharmacy. Please take as directed and contact our office if you believe you are having problem(s) with the medication(s).   Please followup in one year

## 2018-02-15 ENCOUNTER — Encounter: Payer: Self-pay | Admitting: Internal Medicine

## 2018-05-29 ENCOUNTER — Telehealth: Payer: Self-pay | Admitting: Internal Medicine

## 2018-05-29 NOTE — Telephone Encounter (Signed)
Spoke with pt and made an appointment for tomorrow with Dr. Quay Burow.

## 2018-05-29 NOTE — Telephone Encounter (Signed)
Copied from [place] [id number]. Topic: Quick Communication - See Telephone Encounter >> May 29, 2018 12:00 PM Selinda Flavin B, NT wrote: CRM for notification. See Telephone encounter for: 05/29/18. Requesting call back from Dr Quay Burow nurse. States that she hurt the ribs on her left side over the weekend. States that it is the same side as her double mastectomy. Would like a call to discuss. CB#: [phone]

## 2018-05-29 NOTE — Progress Notes (Signed)
Subjective:    Patient ID: Angelica Patton, female    DOB: 08/02/1951, 67 y.o.   MRN: [id number]  HPI The patient is here for an acute visit.   4 days ago she was on her boat and she was reaching over the boat while she was sitting to the dock to tight rope that was tied to the cleat.  The cleat on the boat was just under her left breast.  There was a lot of pressure on her ribs because she was trying to get the rope as tight as possible.  Initially she did not have any pain, but later that day she had significant soreness in that area that felt like a bad bruise.  It was very painful Sunday night and Monday, but after that it has improved.  She thinks it is 50-60% better.  She still has pain, especially with moving and coughing.  She is a history of breast cancer and has had double mastectomy with reconstructive surgery with implants.  Her main concern is that she may have injured the implant or her left ribs.  She denies shortness of breath, coughing, wheezing, fevers and chills.    Medications and allergies reviewed with patient and updated if appropriate.  Patient Active Problem List   Diagnosis Date Noted  . Diarrhea 09/07/2017  . History of pneumonia 07/03/2016  . Anxiety 07/03/2016  . Allergic rhinitis 07/03/2016  . History of breast cancer 07/03/2016  . Osteoporosis 05/23/2016  . Depression   . Breast cancer [facility])     Current Outpatient Medications on File Prior to Visit  Medication Sig Dispense Refill  . ALPRAZolam (XANAX) 0.25 MG tablet TAKE 1 TABLET BY MOUTH AT BEDTIME AS NEEDED FOR ANXIETY 30 tablet 0  . aspirin 81 MG tablet Take 81 mg by mouth daily.    Marland Kitchen azelastine (ASTELIN) 0.1 % nasal spray PLACE 1 PUFF IN EACH NOSTRIL TWICE A DAY  5  . cholecalciferol (VITAMIN D) 1000 UNITS tablet Take 1,000 Units by mouth daily.    Marland Kitchen escitalopram (LEXAPRO) 20 MG tablet Take 1 tablet (20 mg total) by mouth daily. 90 tablet 3  . fluticasone (FLONASE) 50 MCG/ACT nasal spray SPRAY  ONCE IN EACH NOSTRIL EVERY DAY 16 g 8  . levocetirizine (XYZAL) 5 MG tablet Take 5 mg by mouth every evening.  5  . montelukast (SINGULAIR) 10 MG tablet Take 10 mg by mouth every evening.  5  . Multiple Vitamins-Minerals (MULTIVITAMIN PO) Take by mouth.    Marland Kitchen olopatadine (PATANOL) 0.1 % ophthalmic solution PLACE 1 DROP INTO AFFECTED EYE TWICE A DAY  5  . Probiotic Product (ACIDOPHILUS/GOAT MILK) CAPS Take by mouth.    . valACYclovir (VALTREX) 500 MG tablet Take 1 tablet (500 mg total) by mouth as needed. 30 tablet 3   No current facility-administered medications on file prior to visit.     Past Medical History:  Diagnosis Date  . Anxiety   . Breast cancer [facility])    s/p double mastectomy 01/2010  . Depression   . Hypertension   . Osteoporosis   . STD (sexually transmitted disease)    Herpes    Past Surgical History:  Procedure Laterality Date  . COLPOSCOPY    . cone procedure  2010   unsure about date  . double mastectomy    . KNEE SURGERY    . MASTECTOMY  01/25/2010   Bilateral     Social History   Socioeconomic History  . Marital  status: Married    Spouse name: Not on file  . Number of children: Not on file  . Years of education: Not on file  . Highest education level: Not on file  Occupational History  . Occupation: Cytogeneticist  Social Needs  . Financial resource strain: Not on file  . Food insecurity:    Worry: Not on file    Inability: Not on file  . Transportation needs:    Medical: Not on file    Non-medical: Not on file  Tobacco Use  . Smoking status: Former Smoker    Years: 15.00    Types: Cigarettes    Last attempt to quit: 12/06/2012    Years since quitting: 5.4  . Smokeless tobacco: Never Used  . Tobacco comment: quit over a year ago  Substance and Sexual Activity  . Alcohol use: Yes    Comment: moderate-wine 5 glasses per weeks  . Drug use: No  . Sexual activity: Yes    Partners: Male    Birth control/protection: Post-menopausal    Lifestyle  . Physical activity:    Days per week: Not on file    Minutes per session: Not on file  . Stress: Not on file  Relationships  . Social connections:    Talks on phone: Not on file    Gets together: Not on file    Attends religious service: Not on file    Active member of club or organization: Not on file    Attends meetings of clubs or organizations: Not on file    Relationship status: Not on file  Other Topics Concern  . Not on file  Social History Narrative  . Not on file    Family History  Problem Relation Age of Onset  . Ulcers Father   . Alcohol abuse Father   . Heart failure Mother   . Alcohol abuse Mother   . Heart disease Mother   . Depression Sister   . Depression Daughter   . Cancer Paternal Grandmother     Review of Systems  Constitutional: Negative for chills and fever.  Respiratory: Negative for cough, shortness of breath and wheezing.        Objective:   Vitals:   05/30/18 1121  BP: 114/82  Pulse: 70  Resp: 16  Temp: 98.2 F (36.8 C)  SpO2: 97%   BP Readings from Last 3 Encounters:  05/30/18 114/82  01/21/18 126/84  09/07/17 120/74   Wt Readings from Last 3 Encounters:  01/21/18 154 lb (69.9 kg)  03/23/17 155 lb (70.3 kg)  01/03/17 148 lb 0.6 oz (67.2 kg)   Body mass index is 25.24 kg/m.   Physical Exam  Constitutional: She appears well-developed and well-nourished. She does not appear ill. No distress.  HENT:  Head: Normocephalic and atraumatic.  Pulmonary/Chest: Effort normal and breath sounds normal. She exhibits tenderness (Left anterior ribs at the bottom of breast, no rib deformity or swelling) and bony tenderness. She exhibits no deformity and no swelling. Left breast exhibits no skin change and no tenderness.           Assessment & Plan:    See Problem List for Assessment and Plan of chronic medical problems.

## 2018-05-30 ENCOUNTER — Ambulatory Visit (INDEPENDENT_AMBULATORY_CARE_PROVIDER_SITE_OTHER)
Admission: RE | Admit: 2018-05-30 | Discharge: 2018-05-30 | Disposition: A | Discharge status: Home / Self Care | Payer: No Typology Code available for payment source | Source: Ambulatory Visit | Attending: Internal Medicine | Admitting: Internal Medicine

## 2018-05-30 ENCOUNTER — Ambulatory Visit (INDEPENDENT_AMBULATORY_CARE_PROVIDER_SITE_OTHER): Payer: No Typology Code available for payment source | Admitting: Internal Medicine

## 2018-05-30 ENCOUNTER — Encounter: Payer: Self-pay | Admitting: Internal Medicine

## 2018-05-30 VITALS — BP 114/82 | HR 70 | Temp 98.2°F | Resp 16 | Ht 65.5 in

## 2018-05-30 DIAGNOSIS — R0781 Pleurodynia: Secondary | ICD-10-CM

## 2018-05-30 DIAGNOSIS — Z23 Encounter for immunization: Secondary | ICD-10-CM | POA: Diagnosis not present

## 2018-05-30 NOTE — Assessment & Plan Note (Signed)
Probable bone contusion Breast implant seems to be intact, no skin changes or breast deformity.  No tenderness of breast Tenderness is along the ribs-anterior aspect at base of breast, no deformity Will get an x-ray for reassurance Symptomatic treatment Return if no continued improvement

## 2018-05-30 NOTE — Patient Instructions (Signed)
Have an x-ray today - we will call you with the results.

## 2018-06-14 ENCOUNTER — Ambulatory Visit: Payer: No Typology Code available for payment source | Admitting: Obstetrics & Gynecology

## 2018-06-14 ENCOUNTER — Encounter

## 2018-06-17 ENCOUNTER — Telehealth: Payer: Self-pay | Admitting: *Deleted

## 2018-06-17 NOTE — Telephone Encounter (Signed)
Called patient. Notified BMD order faxed today to [facility].  Patient states she will call them to get appt schedule.

## 2018-06-17 NOTE — Telephone Encounter (Signed)
-----   Message from Megan Salon, MD sent at 06/12/2018 11:00 AM EDT ----- Regarding: BMD Angelica Patton, This is another pt that needs her BMD done this fall.  She did it two years ago at [facility].  Would you do the paperwork for this one as well?  We can then let her know to call and schedule.  Thanks.  Vinnie Level

## 2018-07-29 ENCOUNTER — Encounter: Payer: Self-pay | Admitting: Internal Medicine

## 2018-07-30 ENCOUNTER — Telehealth: Payer: Self-pay | Admitting: Obstetrics & Gynecology

## 2018-07-30 NOTE — Telephone Encounter (Signed)
Patient wants to speak with the nurse no information given. °

## 2018-07-30 NOTE — Telephone Encounter (Signed)
Call placed to [facility]. BMD report for 07/29/18 not completed, will fax to [facility] once completed.

## 2018-07-30 NOTE — Telephone Encounter (Signed)
Spoke with patient. BMD at [facility] on 07/29/18. Patient states she was advised she has osteoporosis. Patien calling for report and recommendations. Would like to discuss her exercise regimen, currently taking pilates.    Advised patient I will confirm we have received copy of BMD report, Dr. Sabra Heck will need to review, our office will return call to review results. Patient verbalizes understanding and is agreeable.

## 2018-07-31 NOTE — Telephone Encounter (Signed)
[facility] BMD results received, to Dr. Sabra Heck for review.

## 2018-08-02 NOTE — Telephone Encounter (Signed)
Notified patient results not available as of now. Patient aware will be contacted when results available. Patient agreeable.

## 2018-08-02 NOTE — Telephone Encounter (Signed)
Message left to return call to Triage Nurse at [phone].    

## 2018-08-02 NOTE — Telephone Encounter (Signed)
Patient states she is returning Angelica Patton call for bone density results.

## 2018-08-13 NOTE — Telephone Encounter (Signed)
Call to patient. Per ROI, left message to call back. Can speak to any triage nurse.

## 2018-08-13 NOTE — Telephone Encounter (Signed)
Please let pt know her BMD was stable in her right and left hips/femur.  There is osteopenia here but no change.  Her spine improved from T score -2.7 to -2.4.  This is great.  Report will be scanned into Epic.  I would recommend repeating in two years.

## 2018-09-02 ENCOUNTER — Other Ambulatory Visit: Payer: Self-pay

## 2018-09-02 ENCOUNTER — Encounter: Payer: Self-pay | Admitting: Obstetrics & Gynecology

## 2018-09-02 ENCOUNTER — Other Ambulatory Visit (HOSPITAL_COMMUNITY)
Admission: RE | Admit: 2018-09-02 | Discharge: 2018-09-02 | Disposition: A | Discharge status: Home / Self Care | Payer: No Typology Code available for payment source | Source: Ambulatory Visit | Attending: Obstetrics & Gynecology | Admitting: Obstetrics & Gynecology

## 2018-09-02 ENCOUNTER — Ambulatory Visit (INDEPENDENT_AMBULATORY_CARE_PROVIDER_SITE_OTHER): Payer: No Typology Code available for payment source | Admitting: Obstetrics & Gynecology

## 2018-09-02 VITALS — BP 130/72 | HR 84 | Resp 18 | Ht 65.5 in | Wt 161.4 lb

## 2018-09-02 DIAGNOSIS — Z01419 Encounter for gynecological examination (general) (routine) without abnormal findings: Secondary | ICD-10-CM | POA: Diagnosis not present

## 2018-09-02 DIAGNOSIS — Z124 Encounter for screening for malignant neoplasm of cervix: Secondary | ICD-10-CM | POA: Diagnosis present

## 2018-09-02 DIAGNOSIS — B977 Papillomavirus as the cause of diseases classified elsewhere: Secondary | ICD-10-CM

## 2018-09-02 MED ORDER — VALACYCLOVIR HCL 500 MG PO TABS: 500.0000 mg | ORAL_TABLET | ORAL | 3 refills | Status: DC | PRN

## 2018-09-02 NOTE — Progress Notes (Signed)
67 y.o. G39P1200 Married White or Caucasian female here for annual exam.  Doing well.  Denies vaginal bleeding.  Frustrated with work.   No LMP recorded. Patient is postmenopausal.          Sexually active: Yes.    The current method of family planning is post menopausal status.    Exercising: Yes.    3-4 days weekly  Smoker:  no  Health Maintenance: Pap:  03/23/17 Neg. HR HPV:neg   03/16/16 Neg. HR HPV:+detected. #16, 18/45 Neg  History of abnormal Pap:  yes MMG:  Bilateral Mastectomy 2011 Colonoscopy:  06/15/14 Normal. F/u 5 years  BMD:  07/29/18 Osteopenia, improved TDaP:  Current  Pneumonia vaccine(s):  2019 Shingrix:   Completed  2/19, 4/19 Hep C testing: 08/31/16 neg  Screening Labs: PCP   reports that she quit smoking about 5 years ago. Her smoking use included cigarettes. She quit after 15.00 years of use. She has never used smokeless tobacco. She reports that she drinks about 5.0 standard drinks of alcohol per week. She reports that she does not use drugs.  Past Medical History:  Diagnosis Date  . Anxiety   . Breast cancer [facility])    s/p double mastectomy 01/2010  . Depression   . Hypertension   . Osteoporosis   . STD (sexually transmitted disease)    Herpes    Past Surgical History:  Procedure Laterality Date  . COLPOSCOPY    . cone procedure  2010   unsure about date  . double mastectomy    . KNEE SURGERY    . MASTECTOMY  01/25/2010   Bilateral     Current Outpatient Medications  Medication Sig Dispense Refill  . ALPRAZolam (XANAX) 0.25 MG tablet TAKE 1 TABLET BY MOUTH AT BEDTIME AS NEEDED FOR ANXIETY 30 tablet 0  . aspirin 81 MG tablet Take 81 mg by mouth daily.    Marland Kitchen azelastine (ASTELIN) 0.1 % nasal spray PLACE 1 PUFF IN EACH NOSTRIL TWICE A DAY  5  . cholecalciferol (VITAMIN D) 1000 UNITS tablet Take 1,000 Units by mouth daily.    Marland Kitchen escitalopram (LEXAPRO) 20 MG tablet Take 1 tablet (20 mg total) by mouth daily. 90 tablet 3  . levocetirizine (XYZAL) 5 MG  tablet Take 5 mg by mouth every evening.  5  . montelukast (SINGULAIR) 10 MG tablet Take 10 mg by mouth every evening.  5  . Multiple Vitamins-Minerals (MULTIVITAMIN PO) Take by mouth.    Marland Kitchen olopatadine (PATANOL) 0.1 % ophthalmic solution PLACE 1 DROP INTO AFFECTED EYE TWICE A DAY  5  . Probiotic Product (ACIDOPHILUS/GOAT MILK) CAPS Take by mouth.    . valACYclovir (VALTREX) 500 MG tablet Take 1 tablet (500 mg total) by mouth as needed. 30 tablet 3   No current facility-administered medications for this visit.     Family History  Problem Relation Age of Onset  . Ulcers Father   . Alcohol abuse Father   . Heart failure Mother   . Alcohol abuse Mother   . Heart disease Mother   . Depression Sister   . Depression Daughter   . Cancer Paternal Grandmother     Review of Systems  Musculoskeletal: Positive for myalgias.  Psychiatric/Behavioral:       Depression   All other systems reviewed and are negative.   Exam:   BP 130/72 (BP Location: Right Arm, Patient Position: Sitting, Cuff Size: Large)   Pulse 84   Resp 18   Ht 5' 5.5" (  1.664 m)   Wt 161 lb 6.4 oz (73.2 kg)   BMI 26.45 kg/m    Height: 5' 5.5" (166.4 cm)  Ht Readings from Last 3 Encounters:  09/02/18 5' 5.5" (1.664 m)  05/30/18 5' 5.5" (1.664 m)  01/21/18 5' 5.5" (1.664 m)    General appearance: alert, cooperative and appears stated age Head: Normocephalic, without obvious abnormality, atraumatic Neck: no adenopathy, supple, symmetrical, trachea midline and thyroid normal to inspection and palpation Lungs: clear to auscultation bilaterally Breasts: normal appearance, no masses or tenderness Heart: regular rate and rhythm Abdomen: soft, non-tender; bowel sounds normal; no masses,  no organomegaly Extremities: extremities normal, atraumatic, no cyanosis or edema Skin: Skin color, texture, turgor normal. No rashes or lesions Lymph nodes: Cervical, supraclavicular, and axillary nodes normal. No abnormal inguinal nodes  palpated Neurologic: Grossly normal   Pelvic: External genitalia:  no lesions              Urethra:  normal appearing urethra with no masses, tenderness or lesions              Bartholins and Skenes: normal                 Vagina: normal appearing vagina with normal color and discharge, no lesions              Cervix: no lesions              Pap taken: Yes.   Bimanual Exam:  Uterus:  normal size, contour, position, consistency, mobility, non-tender              Adnexa: normal adnexa and no mass, fullness, tenderness               Rectovaginal: Confirms               Anus:  normal sphincter tone, no lesions  Chaperone was present for exam.  A:  Well Woman with normal exam PMP, no HRT H/O breast cancer, s/p bilateral mastectomy.  Did not need chemo or radiation.  S/p reconstruction. Frustrations with weight Osteopenia with recent BMD which is improved H/o abnormal pap smear with remote hx of conization.  H/O +HR HPV 2017.  P:   Mammogram guidelines reviewed Aware BMD should be repeated in 2 years RX for Valtrex 500mg  orally as needed printed for pt.  Hasn't used in >1 year pap smear with HR HPV obtained today Aware colonoscopy is due next year Return annually or prn

## 2018-09-03 LAB — CYTOLOGY - PAP
Diagnosis: NEGATIVE
HPV (WINDOPATH): DETECTED — AB
HPV 16/18/45 GENOTYPING: NEGATIVE

## 2018-09-05 NOTE — Telephone Encounter (Signed)
Patient has seen Dr. Sabra Heck for annual exam.  Scan in chart.  Will close encounter.

## 2018-09-06 ENCOUNTER — Telehealth: Payer: Self-pay | Admitting: *Deleted

## 2018-09-06 NOTE — Telephone Encounter (Signed)
-----   Message from Megan Salon, MD sent at 09/04/2018  8:28 PM EST ----- Please let pt know her pap was normal but the HR HPV was positive this year.  The 16/18/45 was negative so does not need a colposcopy.  Needs repeat pap and HR HPV in one year.  Test was positive 2 years ago but negative last year.  She may have questions about this.  If there is just a small amount of it on the cervix, it could be more difficult to get on a pap smear and that is partly why you need two normal pap smears and two negative HR HPV tests a full year apart before skipping any pap smears.  Needs 08 recall and out of current recall.  Thanks.  CC:  Lowell Bouton, CMA

## 2018-09-06 NOTE — Telephone Encounter (Signed)
Notes recorded by Burnice Logan, RN on 09/06/2018 at 9:26 AM EST Left message to call Sharee Pimple, RN at [facility].   08 recall

## 2018-09-06 NOTE — Telephone Encounter (Signed)
Patient returning call to Jill. °

## 2018-09-06 NOTE — Telephone Encounter (Signed)
Spoke with patient, advised as seen below per Dr. Sabra Heck. AEX scheduled for 10/02/2019 at 11am with Dr. Sabra Heck, patient added to wait list. Patient verbalizes understanding and is agreeable.   Encounter closed.

## 2018-09-30 ENCOUNTER — Ambulatory Visit (INDEPENDENT_AMBULATORY_CARE_PROVIDER_SITE_OTHER): Payer: No Typology Code available for payment source

## 2018-09-30 ENCOUNTER — Encounter: Payer: Self-pay | Admitting: Podiatry

## 2018-09-30 ENCOUNTER — Other Ambulatory Visit: Payer: Self-pay | Admitting: Podiatry

## 2018-09-30 ENCOUNTER — Ambulatory Visit (INDEPENDENT_AMBULATORY_CARE_PROVIDER_SITE_OTHER): Payer: No Typology Code available for payment source | Admitting: Podiatry

## 2018-09-30 VITALS — BP 128/80 | HR 79

## 2018-09-30 DIAGNOSIS — M79671 Pain in right foot: Secondary | ICD-10-CM

## 2018-09-30 DIAGNOSIS — M7751 Other enthesopathy of right foot: Secondary | ICD-10-CM

## 2018-09-30 DIAGNOSIS — M2041 Other hammer toe(s) (acquired), right foot: Secondary | ICD-10-CM | POA: Diagnosis not present

## 2018-09-30 DIAGNOSIS — M779 Enthesopathy, unspecified: Secondary | ICD-10-CM

## 2018-09-30 MED ORDER — TRIAMCINOLONE ACETONIDE 10 MG/ML IJ SUSP
10.0000 mg | Freq: Once | INTRAMUSCULAR | Status: AC
  Administered 2018-09-30: 10 mg

## 2018-09-30 NOTE — Patient Instructions (Signed)
Hammer Toe  Hammer toe is a change in the shape (a deformity) of your toe. The deformity causes the middle joint of your toe to stay bent. This causes pain, especially when you are wearing shoes. Hammer toe starts gradually. At first, the toe can be straightened. Gradually over time, the deformity becomes stiff and permanent. Early treatments to keep the toe straight may relieve pain. As the deformity becomes stiff and permanent, surgery may be needed to straighten the toe. What are the causes? Hammer toe is caused by abnormal bending of the toe joint that is closest to your foot. It happens gradually over time. This pulls on the muscles and connections (tendons) of the toe joint, making them weak and stiff. It is often related to wearing shoes that are too short or narrow and do not let your toes straighten. What increases the risk? You may be at greater risk for hammer toe if you:  Are female.  Are older.  Wear shoes that are too small.  Wear high-heeled shoes that pinch your toes.  Are a ballet dancer.  Have a second toe that is longer than your big toe (first toe).  Injure your foot or toe.  Have arthritis.  Have a family history of hammer toe.  Have a nerve or muscle disorder. What are the signs or symptoms? The main symptoms of this condition are pain and deformity of the toe. The pain is worse when wearing shoes, walking, or running. Other symptoms may include:  Corns or calluses over the bent part of the toe or between the toes.  Redness and a burning feeling on the toe.  An open sore that forms on the top of the toe.  Not being able to straighten the toe. How is this diagnosed? This condition is diagnosed based on your symptoms and a physical exam. During the exam, your health care provider will try to straighten your toe to see how stiff the deformity is. You may also have tests, such as:  A blood test to check for rheumatoid arthritis.  An X-ray to show how  severe the deformity is. How is this treated? Treatment for this condition will depend on how stiff the deformity is. Surgery is often needed. However, sometimes a hammer toe can be straightened without surgery. Treatments that do not involve surgery include:  Taping the toe into a straightened position.  Using pads and cushions to protect the toe (orthotics).  Wearing shoes that provide enough room for the toes.  Doing toe-stretching exercises at home.  Taking an NSAID to reduce pain and swelling. If these treatments do not help or the toe cannot be straightened, surgery is the next option. The most common surgeries used to straighten a hammer toe include:  Arthroplasty. In this procedure, part of the joint is removed, and that allows the toe to straighten.  Fusion. In this procedure, cartilage between the two bones of the joint is taken out and the bones are fused together into one longer bone.  Implantation. In this procedure, part of the bone is removed and replaced with an implant to let the toe move again.  Flexor tendon transfer. In this procedure, the tendons that curl the toes down (flexor tendons) are repositioned. Follow these instructions at home:  Take over-the-counter and prescription medicines only as told by your health care provider.  Do toe straightening and stretching exercises as told by your health care provider.  Keep all follow-up visits as told by your health care   provider. This is important. How is this prevented?  Wear shoes that give your toes enough room and do not cause pain.  Do not wear high-heeled shoes. Contact a health care provider if:  Your pain gets worse.  Your toe becomes red or swollen.  You develop an open sore on your toe. This information is not intended to replace advice given to you by your health care provider. Make sure you discuss any questions you have with your health care provider. Document Released: 09/08/2000 Document  Revised: 04/09/2017 Document Reviewed: 01/05/2016 Elsevier Interactive Patient Education  2019 Elsevier Inc.  

## 2018-10-02 NOTE — Progress Notes (Signed)
Subjective:   Patient ID: Angelica Patton, female   DOB: 68 y.o.   MRN: [id number]   HPI Patient states she has had a year and a half duration pain in the right second digit which is increasingly making it hard for her to be comfortable and wear shoe gear properly.  Patient states that she did fracture the toe she thinks and it is increasingly painful since then and she develops a blister on the second toe with pressure.  Patient does not smoke and likes to be active   Review of Systems  All other systems reviewed and are negative.       Objective:  Physical Exam Vitals signs and nursing note reviewed.  Constitutional:      Appearance: She is well-developed.  Pulmonary:     Effort: Pulmonary effort is normal.  Musculoskeletal: Normal range of motion.  Skin:    General: Skin is warm.  Neurological:     Mental Status: She is alert.     Neurovascular status found to be intact muscle strength is adequate range of motion within normal limits with patient found to have prominence of the head of the proximal phalanx second digit right medial side with inflammation fluid around the joint that is painful when pressed and make shoe gear difficult with moderate deformity of the big toe also noted.  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  Discussed the year and a half duration of this and the chronic blistering and I do think long-term that arthroplasty would be of best benefit to her.  She wants to do this but wants short-term relief as best as possible and today I did go ahead and I did a proximal nerve block second digit and I then carefully injected the medial side with 1 mg dexamethasone 1 mg Kenalog and applied padding to reduce pressure against the toe.  I then went ahead and allowed her to read over  consent form for correction of the digit explaining all alternative treatments complications associated with this surgery and patient wants procedure understanding risk.  Patient signed consent form after extensive review and is scheduled when she returns from her cruise in the next 3 to 4 weeks for procedure and is encouraged to call with any questions and understand total recovery can take 3 to 6 months  X-ray indicates there is some hypertrophy of the right second digit where there was a probable traumatic element to the toe itself

## 2018-10-14 ENCOUNTER — Encounter: Payer: Self-pay | Admitting: Family Medicine

## 2018-10-14 ENCOUNTER — Telehealth: Payer: No Typology Code available for payment source | Admitting: Family Medicine

## 2018-10-14 DIAGNOSIS — R0981 Nasal congestion: Secondary | ICD-10-CM

## 2018-10-14 NOTE — Progress Notes (Signed)
Based on what you shared with me it looks like you have a condition that should be evaluated in a face to face office visit.  NOTE: If you entered your credit card information for this eVisit, you will not be charged. You may see a "hold" on your card for the $30 but that hold will drop off and you will not have a charge processed.  I am not sure what is going on from your questionnaire but I think that your concerns will be best addressed via an in-person provider to conduct a proper history and physical to help you address concerns and medication requests.  If you are having a true medical emergency please call 911.  If you need an urgent face to face visit, [facility] has four urgent care centers for your convenience.  If you need care fast and have a high deductible or no insurance consider:   [email or web address] to reserve your spot online an avoid wait times  [facility] [address], [address] [place], [place] [address] 8 am to 8 pm Monday-Friday 10 am to 4 pm Saturday-Sunday *Across the street from [employer]  [place], [address] 8 am to 5 pm Monday-Friday * In the [facility] on the [facility]   The following sites will take your  insurance:  . [facility] [place] a Provider at this Location  [address] [place], [place] [address] . 10 am to 8 pm Monday-Friday . 12 pm to 8 pm Saturday-Sunday   . [facility] [place] a Provider at this Location  [address] [place], [place], [place] [address] . 8 am to 8 pm Monday-Friday . 9 am to 6 pm Saturday . 11 am to 6 pm Sunday   . [facility] at [place] Get Driving Directions  [address] [place],  [address] . 8 am to 8 pm Monday-Friday . 8 am to 4 pm  Saturday-Sunday   Your e-visit answers were reviewed by a board certified advanced clinical practitioner to complete your personal care plan.  Thank you for using e-Visits.

## 2018-10-15 ENCOUNTER — Encounter: Payer: Self-pay | Admitting: Internal Medicine

## 2018-10-15 DIAGNOSIS — R0683 Snoring: Secondary | ICD-10-CM

## 2018-10-29 ENCOUNTER — Telehealth: Payer: Self-pay | Admitting: *Deleted

## 2018-10-29 NOTE — Telephone Encounter (Signed)
"  I have surgery scheduled with Dr. Paulla Dolly on February 12.  I got a note today from [place] that indicated a couple of procedures after the surgery on the twelfeth that I have questions about.  I want to ask you about the cast after the surgery.  Please give me a call."

## 2018-10-30 NOTE — Telephone Encounter (Signed)
"  I have a few questions.  I have an appointment that I saw in [place] scheduled for March 11.  Will my stitches be in that long?"  No, you have another appointment scheduled for February 26 at 1:30 pm.  "I was hoping the stitches wouldn't be in for a month.  "How long will my surgery take?"  Your surgery will take about 15 minutes but she has to get you prepped, so you may be here for a hour.  "Can you tell me when I'll be able to drive?"  Of course, if you are taking the narcotics, you cannot drive.  It depends on how you a progressing.  "A friend of mine told me that the boot I'm going to be wearing costs about $300.  Is there somewhere else I can get a boot that is cheaper?"  You will not be getting a boot, you will be given a surgical shoe.  "A surgical shoe costs $300?"  No, the boot costs that amount.  You will not be getting a boot.  "Okay, thanks for answering my questions.

## 2018-11-06 ENCOUNTER — Encounter: Payer: Self-pay | Admitting: Podiatry

## 2018-11-06 ENCOUNTER — Ambulatory Visit (INDEPENDENT_AMBULATORY_CARE_PROVIDER_SITE_OTHER): Payer: No Typology Code available for payment source | Admitting: Podiatry

## 2018-11-06 ENCOUNTER — Other Ambulatory Visit: Payer: Self-pay | Admitting: Podiatry

## 2018-11-06 ENCOUNTER — Telehealth: Payer: Self-pay | Admitting: *Deleted

## 2018-11-06 ENCOUNTER — Telehealth: Payer: Self-pay | Admitting: Podiatry

## 2018-11-06 VITALS — BP 133/78 | HR 86 | Temp 97.7°F | Resp 16

## 2018-11-06 DIAGNOSIS — M2041 Other hammer toe(s) (acquired), right foot: Secondary | ICD-10-CM | POA: Diagnosis not present

## 2018-11-06 MED ORDER — HYDROCODONE-IBUPROFEN 5-200 MG PO TABS: 1.0000 | ORAL_TABLET | Freq: Three times a day (TID) | ORAL | 0 refills | Status: DC | PRN

## 2018-11-06 NOTE — Progress Notes (Signed)
Subjective:   Patient ID: Angelica Patton, female   DOB: 68 y.o.   MRN: [id number]   HPI Patient presents stating I am hearing a hammertoe defect that is been sore and making it difficult to wear shoe gear comfortably   ROS      Objective:  Physical Exam  Neurovascular status intact with patient found to have keratotic lesion digit to right with enlargement of the head of proximal phalanx     Assessment:  Chronic hammertoe deformity second right     Plan:  H&P condition reviewed and arthroplasty recommended.  Patient wants procedure and I explained procedure risk and at this time patient had the toe anesthetized 60 mg like Marcaine mixture sterile prep applied to the right foot and the right ankle tourniquet flighted to 150 mmHg.  Follow procedure was performed 10 July dorsal aspect digit to right with some elliptical linear incision was made over the interphalangeal joint.  The intervening skin wedge was removed in toto and a transverse incision into the extensor expansion was made the level interphalangeal joint.  The medial lateral collateral ligament to the interphalangeal joint was severed and the head of the proximal phalanx was delivered from the wound and resected in toto.  Wound was flushed copious comprised solution and the wound edges were sutured with 5-0 nylon.  Sterile dressing applied and tourniquet released capillary fill noted be immediate to all digits on the toe and patient left the OR in satisfactory condition

## 2018-11-06 NOTE — Telephone Encounter (Signed)
Pt states she was to get a pain medication prescription and sock but got neither. Pt states the anesthesia has worn off and she would definitely like the pain medication called to the pharmacy within the hour so her husband can pick it up.

## 2018-11-06 NOTE — Telephone Encounter (Signed)
I had office surgery this morning and nobody has [facility] [address]

## 2018-11-06 NOTE — Telephone Encounter (Signed)
[facility] [address] [facility] [place] [facility] Requested Rx be sent there.

## 2018-11-06 NOTE — Progress Notes (Signed)
Pain mgmt

## 2018-11-06 NOTE — Telephone Encounter (Signed)
Dr[facility] Amalia Hailey [facility] [address] [facility].

## 2018-11-06 NOTE — Progress Notes (Signed)
[facility] [place].

## 2018-11-07 MED ORDER — HYDROCODONE-ACETAMINOPHEN 10-325 MG PO TABS: 1.0000 | ORAL_TABLET | Freq: Three times a day (TID) | ORAL | 0 refills | Status: DC | PRN

## 2018-11-07 NOTE — Telephone Encounter (Signed)
[facility]. Check that she is doing ok

## 2018-11-07 NOTE — Addendum Note (Signed)
Addended by: Wallene Huh on: 11/07/2018 12:02 PM   Modules accepted: Orders

## 2018-11-07 NOTE — Addendum Note (Signed)
Addended by: Harriett Sine D on: 11/07/2018 01:21 PM   Modules accepted: Orders

## 2018-11-07 NOTE — Telephone Encounter (Signed)
I spoke with pt she states she is fine, but neither of the [facility] had the Vicoprofen and she is using 3 or 4 200mg  Ibuprofen every 6 hours. I told pt she should take 800mg  Ibuprofen every 8 hours. Pt states understanding and said Dr.Regal said she could take a shower in one week. I told pt it should be a quick shower and no standing water. Pt states understanding.

## 2018-11-20 ENCOUNTER — Ambulatory Visit (INDEPENDENT_AMBULATORY_CARE_PROVIDER_SITE_OTHER): Payer: No Typology Code available for payment source

## 2018-11-20 ENCOUNTER — Encounter: Payer: Self-pay | Admitting: Internal Medicine

## 2018-11-20 ENCOUNTER — Ambulatory Visit (INDEPENDENT_AMBULATORY_CARE_PROVIDER_SITE_OTHER): Payer: Self-pay | Admitting: Podiatry

## 2018-11-20 ENCOUNTER — Encounter: Payer: Self-pay | Admitting: Podiatry

## 2018-11-20 DIAGNOSIS — Z09 Encounter for follow-up examination after completed treatment for conditions other than malignant neoplasm: Secondary | ICD-10-CM

## 2018-11-20 DIAGNOSIS — M2041 Other hammer toe(s) (acquired), right foot: Secondary | ICD-10-CM

## 2018-11-23 NOTE — Progress Notes (Signed)
Subjective:   Patient ID: Angelica Patton, female   DOB: 68 y.o.   MRN: [id number]   HPI Patient states she is doing really well with minimal discomfort and is wearing shoe gear   ROS      Objective:  Physical Exam  Neurovascular status intact with patient's right second toe doing well wound edges well coapted digit in good alignment     Assessment:  Doing well post arthroplasty digit to right     Plan:  Advised on wider shoes and gradual return to normal activity with sutures removed today wound edges well coapted  X-rays indicate that there is good alignment of the bone with good structural correction

## 2018-12-02 ENCOUNTER — Other Ambulatory Visit: Payer: Self-pay | Admitting: Internal Medicine

## 2018-12-02 MED ORDER — LEVOCETIRIZINE DIHYDROCHLORIDE 5 MG PO TABS: 5.0000 mg | ORAL_TABLET | Freq: Every evening | ORAL | 0 refills | Status: DC

## 2018-12-02 MED ORDER — OLOPATADINE HCL 0.1 % OP SOLN: OPHTHALMIC | 0 refills | Status: DC

## 2018-12-02 MED ORDER — MONTELUKAST SODIUM 10 MG PO TABS: 10.0000 mg | ORAL_TABLET | Freq: Every evening | ORAL | 0 refills | Status: DC

## 2018-12-02 MED ORDER — AZELASTINE HCL 0.1 % NA SOLN: NASAL | 0 refills | Status: DC

## 2018-12-02 NOTE — Telephone Encounter (Signed)
Copied from [place] [id number]. Topic: Quick Communication - Rx Refill/Question >> Dec 02, 2018 10:07 AM Ahmed Prima L wrote: Medication: olopatadine (PATANOL) 0.1 % ophthalmic solution , montelukast (SINGULAIR) 10 MG tablet, levocetirizine (XYZAL) 5 MG tablet & azelastine (ASTELIN) 0.1 % nasal spray  Has the patient contacted their pharmacy? Yes - she said that Dr Quay Burow agreed to take over these medications since she was not seeing the Dr Orvil Feil anymore.  (Agent: If no, request that the patient contact the pharmacy for the refill.) (Agent: If yes, when and what did the pharmacy advise?)  Preferred Pharmacy (with phone number or street name): [id number] [place], [address]  Agent: Please be advised that RX refills may take up to 3 business days. We ask that you follow-up with your pharmacy.

## 2018-12-02 NOTE — Telephone Encounter (Signed)
Requested medication (s) are due for refill today:  yes  Requested medication (s) are on the active medication list:  yes  Future visit scheduled:  no  Last Refill: all requested medications Astelin, Xyzal, Singulair, Patenol were noted to be filled by historical provider  Requested Prescriptions  Pending Prescriptions Disp Refills   azelastine (ASTELIN) 0.1 % nasal spray 30 mL 5    Sig: PLACE 1 PUFF IN EACH NOSTRIL TWICE A DAY     Ear, Nose, and Throat: Nasal Preparations - Antiallergy Passed - 12/02/2018 11:29 AM      Passed - Valid encounter within last 12 months    Recent Outpatient Visits          6 months ago Rib pain on left side   [place], MD   10 months ago Depression, unspecified depression type   [place], Claudina Lick, MD   1 year ago Diarrhea, unspecified type   [place], MD   1 year ago Upper respiratory tract infection, unspecified type   [place], MD   2 years ago Upper respiratory tract infection, unspecified type   [facility]rap[facility] at [place], NP            levocetirizine (XYZAL) 5 MG tablet 30 tablet 5    Sig: Take 1 tablet (5 mg total) by mouth every evening.     Ear, Nose, and Throat:  Antihistamines Passed - 12/02/2018 11:29 AM      Passed - Valid encounter within last 12 months    Recent Outpatient Visits          6 months ago Rib pain on left side   [place], Claudina Lick, MD   10 months ago Depression, unspecified depression type   [place], Claudina Lick, MD   1 year ago Diarrhea, unspecified type   [place], Claudina Lick, MD   1 year ago Upper respiratory tract infection, unspecified type   [place], MD   2 years ago Upper respiratory  tract infection, unspecified type   Therapist, [facility] at [facility] [employer], [place], NP            montelukast (SINGULAIR) 10 MG tablet 30 tablet 5    Sig: Take 1 tablet (10 mg total) by mouth every evening.     Pulmonology:  Leukotriene Inhibitors Passed - 12/02/2018 11:29 AM      Passed - Valid encounter within last 12 months    Recent Outpatient Visits          6 months ago Rib pain on left side   [place], Claudina Lick, MD   10 months ago Depression, unspecified depression type   [place], Claudina Lick, MD   1 year ago Diarrhea, unspecified type   [place], Claudina Lick, MD   1 year ago Upper respiratory tract infection, unspecified type   [place], MD   2 years ago Upper respiratory tract infection, unspecified type   Therapist, music at [employer], [place], NP            olopatadine (PATANOL) 0.1 % ophthalmic solution 5 mL 5    Sig: PLACE 1  DROP INTO AFFECTED EYE TWICE A DAY     Ophthalmology:  Antiallergy Passed - 12/02/2018 11:29 AM      Passed - Valid encounter within last 12 months    Recent Outpatient Visits          6 months ago Rib pain on left side   [place], MD   10 months ago Depression, unspecified depression type   [place], Claudina Lick, MD   1 year ago Diarrhea, unspecified type   [place], MD   1 year ago Upper respiratory tract infection, unspecified type   [place], MD   2 years ago Upper respiratory tract infection, unspecified type   Therapist, 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

## 2019-01-12 ENCOUNTER — Other Ambulatory Visit: Payer: Self-pay | Admitting: Internal Medicine

## 2019-01-13 ENCOUNTER — Other Ambulatory Visit: Payer: Self-pay | Admitting: Internal Medicine

## 2019-01-13 MED ORDER — AZELASTINE HCL 0.1 % NA SOLN: NASAL | 0 refills | Status: DC

## 2019-01-13 MED ORDER — MONTELUKAST SODIUM 10 MG PO TABS: 10.0000 mg | ORAL_TABLET | Freq: Every evening | ORAL | 0 refills | Status: DC

## 2019-01-13 MED ORDER — LEVOCETIRIZINE DIHYDROCHLORIDE 5 MG PO TABS: 5.0000 mg | ORAL_TABLET | Freq: Every evening | ORAL | 0 refills | Status: DC

## 2019-01-13 NOTE — Telephone Encounter (Signed)
Requested Prescriptions  Pending Prescriptions Disp Refills  . levocetirizine (XYZAL) 5 MG tablet 90 tablet 0    Sig: Take 1 tablet (5 mg total) by mouth every evening.     Ear, Nose, and Throat:  Antihistamines Passed - 01/13/2019 11:13 AM      Passed - Valid encounter within last 12 months    Recent Outpatient Visits          7 months ago Rib pain on left side   [place], MD   11 months ago Depression, unspecified depression type   [place], Claudina Lick, MD   1 year ago Diarrhea, unspecified type   [place], Claudina Lick, MD   2 years ago Upper respiratory tract infection, unspecified type   [place], MD   2 years ago Upper respiratory tract infection, unspecified type   Therapist, [facility] at [employer], [place], NP           . azelastine (ASTELIN) 0.1 % nasal spray 30 mL 0    Sig: PLACE 1 PUFF IN EACH NOSTRIL TWICE A DAY     Ear, Nose, and Throat: Nasal Preparations - Antiallergy Passed - 01/13/2019 11:13 AM      Passed - Valid encounter within last 12 months    Recent Outpatient Visits          7 months ago Rib pain on left side   [place], Claudina Lick, MD   11 months ago Depression, unspecified depression type   [place], Claudina Lick, MD   1 year ago Diarrhea, unspecified type   [place], MD   2 years ago Upper respiratory tract infection, unspecified type   [place], MD   2 years ago Upper respiratory tract infection, unspecified type   Therapist, [facility] at [place], NP           . montelukast (SINGULAIR) 10 MG tablet 90 tablet 0    Sig: Take 1 tablet (10 mg total) by mouth every evening.     Pulmonology:  Leukotriene Inhibitors Passed -  01/13/2019 11:13 AM      Passed - Valid encounter within last 12 months    Recent Outpatient Visits          7 months ago Rib pain on left side   [place], MD   11 months ago Depression, unspecified depression type   [place], Claudina Lick, MD   1 year ago Diarrhea, unspecified type   [place], Claudina Lick, MD   2 years ago Upper respiratory tract infection, unspecified type   [place], MD   2 years ago Upper respiratory tract infection, unspecified type   Therapist, [facility] at [employer], [place], NP           . olopatadine (PATANOL) 0.1 % ophthalmic solution 5 mL 0     Ophthalmology:  Antiallergy Passed - 01/13/2019 11:13 AM      Passed - Valid encounter within last 12 months    Recent Outpatient Visits          7 months ago Rib pain on left side   [facility], MD   11 months ago Depression, unspecified depression type   [place], MD   1 year ago Diarrhea, unspecified type   [place], MD   2 years ago Upper respiratory tract infection, unspecified type   [place], MD   2 years ago Upper respiratory tract infection, unspecified type   Therapist, music at [employer], [place], [place]

## 2019-01-13 NOTE — Telephone Encounter (Signed)
Patient is requesting additional refills on these medications

## 2019-02-04 ENCOUNTER — Other Ambulatory Visit: Payer: Self-pay | Admitting: Internal Medicine

## 2019-02-04 ENCOUNTER — Encounter: Payer: Self-pay | Admitting: Internal Medicine

## 2019-02-04 ENCOUNTER — Ambulatory Visit (INDEPENDENT_AMBULATORY_CARE_PROVIDER_SITE_OTHER): Payer: No Typology Code available for payment source | Admitting: Internal Medicine

## 2019-02-04 DIAGNOSIS — F329 Major depressive disorder, single episode, unspecified: Secondary | ICD-10-CM

## 2019-02-04 DIAGNOSIS — F419 Anxiety disorder, unspecified: Secondary | ICD-10-CM

## 2019-02-04 DIAGNOSIS — F32A Depression, unspecified: Secondary | ICD-10-CM

## 2019-02-04 DIAGNOSIS — J309 Allergic rhinitis, unspecified: Secondary | ICD-10-CM | POA: Diagnosis not present

## 2019-02-04 MED ORDER — ESCITALOPRAM OXALATE 20 MG PO TABS: 20.0000 mg | ORAL_TABLET | Freq: Every day | ORAL | 3 refills | Status: DC

## 2019-02-04 NOTE — Assessment & Plan Note (Signed)
Anxiety is well controlled Continue Lexapro 20 mg daily Continue Xanax as needed

## 2019-02-04 NOTE — Assessment & Plan Note (Signed)
Well-controlled with current medication regimen Continue Xyzal, Singulair, nasal spray and eyedrops I will prescribe all of her allergy medication since she is doing so well

## 2019-02-04 NOTE — Progress Notes (Signed)
Virtual Visit via Video Note  I connected with Angelica Patton on 02/04/19 at  3:00 PM EDT by a video enabled telemedicine application and verified that I am speaking with the correct person using two identifiers.   I discussed the limitations of evaluation and management by telemedicine and the availability of in person appointments. The patient expressed understanding and agreed to proceed.  The patient is currently at home and I am in the office.    No referring provider.    History of Present Illness: She is here for follow up of her chronic medical conditions.    Allergic rhinitis:  She is taking xyzal, Singulair, eye drops and using the astelin nasal spray.  She feels her allergies are well controlled.  She is happy with her current medication.  Depression: She is taking her medication daily as prescribed. She denies any side effects from the medication. She feels her depression is well controlled and she is happy with her current dose of medication.   Anxiety: She is taking her medication daily as prescribed. She denies any side effects from the medication. She feels her anxiety is well controlled and she is happy with her current dose of medication.   ?  Arthritis: She feels like she is starting to get some arthritis.  She states the pain is an achy pain mostly in her arms and hands.  The intensity of the pain varies and she does not need to take something all the time, but plans on trying extra strength Tylenol and Aleve twice daily if needed.  This seems to work for her sister who is older than her.  She has tried ibuprofen in the past and it has not helped.   Review of Systems  Constitutional: Negative for chills and fever.  Respiratory: Negative for cough, shortness of breath and wheezing.   Cardiovascular: Negative for chest pain, palpitations and leg swelling.  Musculoskeletal: Positive for joint pain.  Neurological: Negative for headaches.     Social History    Socioeconomic History  . Marital status: Married    Spouse name: Not on file  . Number of children: Not on file  . Years of education: Not on file  . Highest education level: Not on file  Occupational History  . Occupation: Cytogeneticist  Social Needs  . Financial resource strain: Not on file  . Food insecurity:    Worry: Not on file    Inability: Not on file  . Transportation needs:    Medical: Not on file    Non-medical: Not on file  Tobacco Use  . Smoking status: Former Smoker    Years: 15.00    Types: Cigarettes    Last attempt to quit: 12/06/2012    Years since quitting: 6.1  . Smokeless tobacco: Never Used  . Tobacco comment: quit over a year ago  Substance and Sexual Activity  . Alcohol use: Yes    Alcohol/week: 5.0 standard drinks    Types: 5 Glasses of wine per week  . Drug use: No  . Sexual activity: Yes    Partners: Male    Birth control/protection: Post-menopausal  Lifestyle  . Physical activity:    Days per week: Not on file    Minutes per session: Not on file  . Stress: Not on file  Relationships  . Social connections:    Talks on phone: Not on file    Gets together: Not on file    Attends religious service: Not on file  Active member of club or organization: Not on file    Attends meetings of clubs or organizations: Not on file    Relationship status: Not on file  Other Topics Concern  . Not on file  Social History Narrative  . Not on file     Observations/Objective: Appears well in NAD Normal mood and affect  Assessment and Plan:  See Problem List for Assessment and Plan of chronic medical problems.   Follow Up Instructions:    I discussed the assessment and treatment plan with the patient. The patient was provided an opportunity to ask questions and all were answered. The patient agreed with the plan and demonstrated an understanding of the instructions.   The patient was advised to call back or seek an in-person evaluation if  the symptoms worsen or if the condition fails to improve as anticipated.  When able depending on the coronavirus situation she will schedule a physical  Binnie Rail, MD

## 2019-02-04 NOTE — Telephone Encounter (Signed)
LVM for pt to call back and schedule appt for refill.

## 2019-02-04 NOTE — Assessment & Plan Note (Signed)
Depression is well controlled Continue Lexapro 20 mg daily

## 2019-02-10 IMAGING — DX DG RIBS 2V*L*
2 series · 2 of 2 positions shown · non-contrast
Comparison: Chest x-ray dated 03/29/2016.

CLINICAL DATA: Injury, mid rib pain

EXAM:
LEFT RIBS - 2 VIEW

[rib pa]
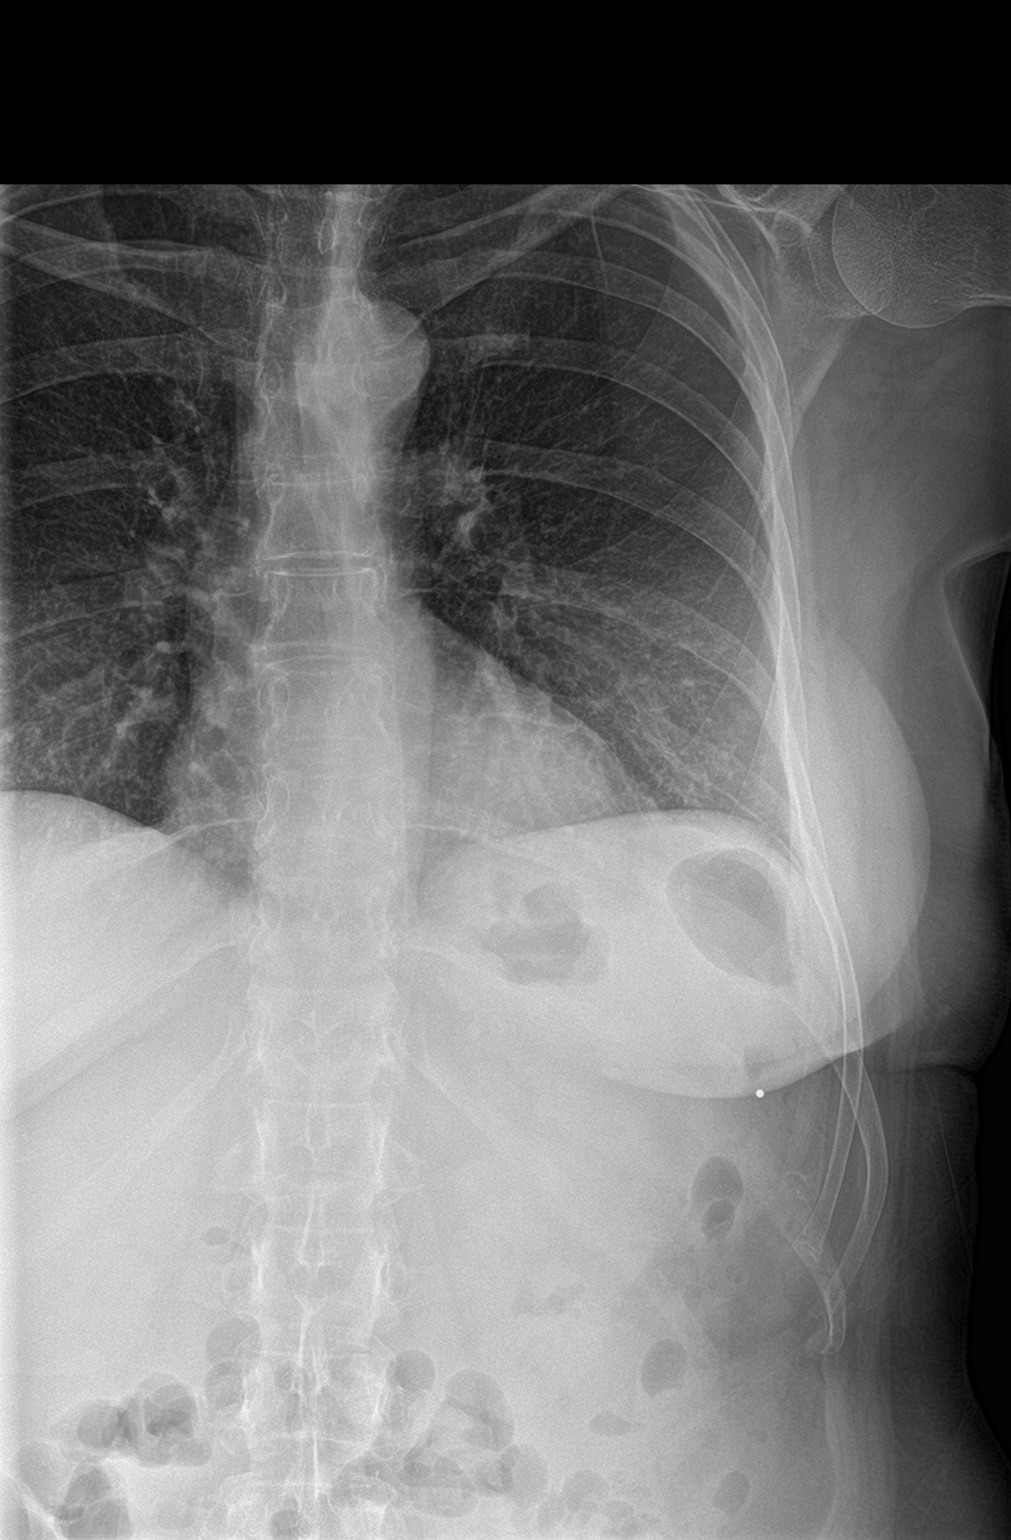

[rib obl]
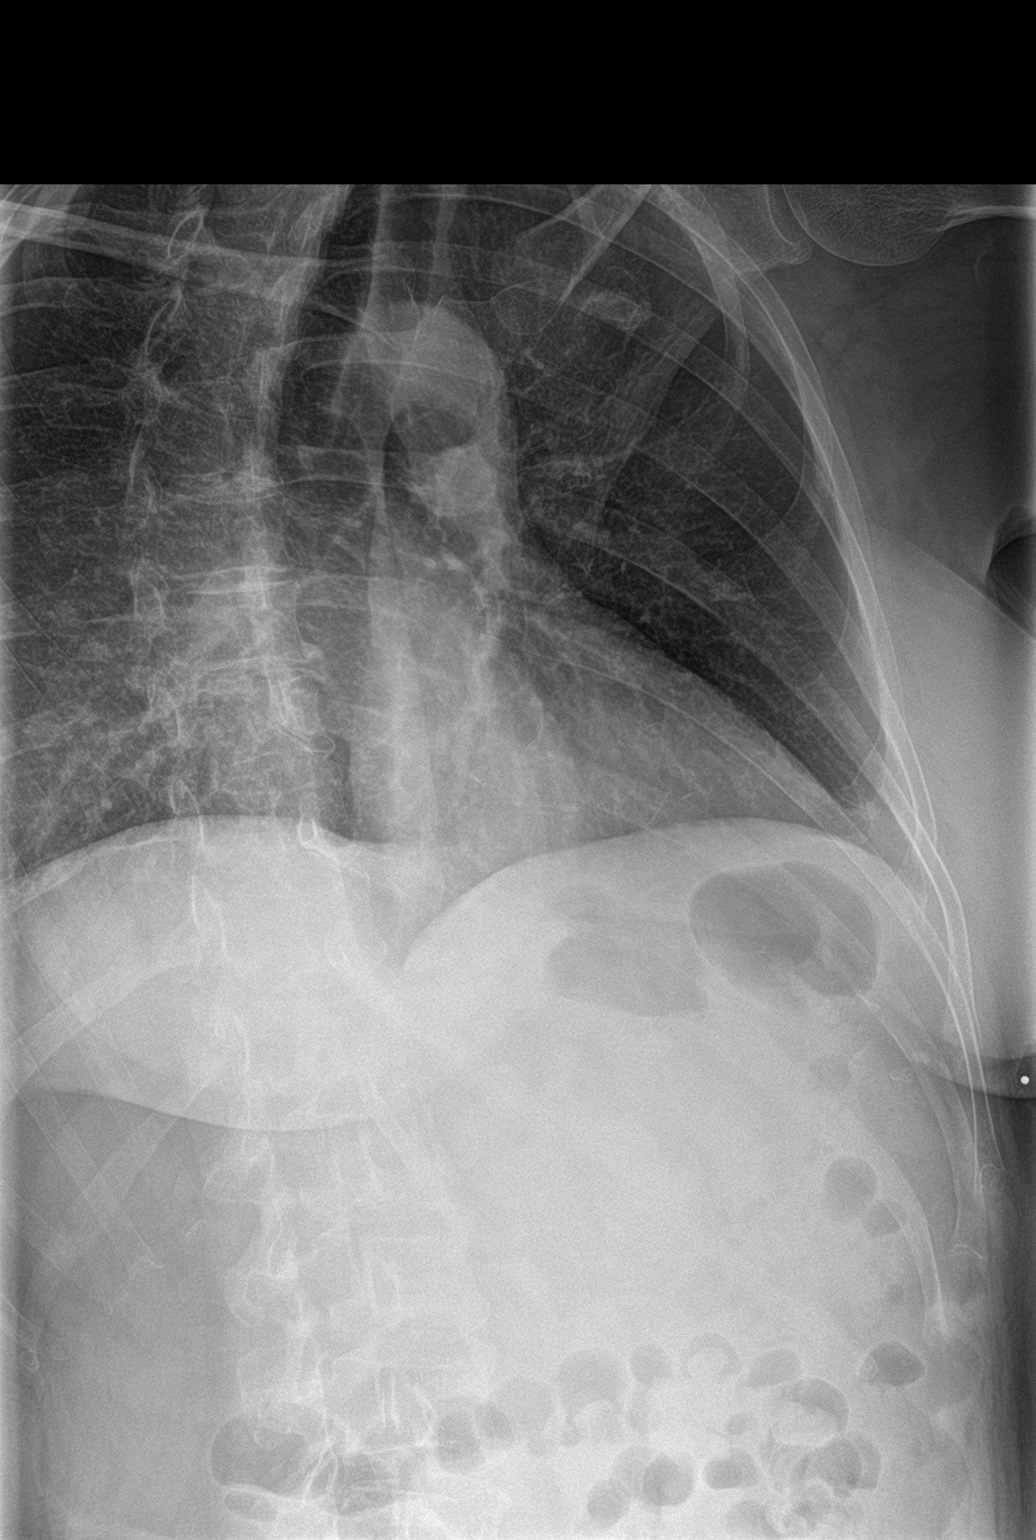

[2 of 2 positions shown; findings below may reference images not displayed]

FINDINGS: Two views of the LEFT ribs are provided. Osseous alignment is
normal. No fracture line or displaced fracture fragment seen. LEFT
lung appears clear.
IMPRESSION: Negative.

## 2019-02-18 ENCOUNTER — Other Ambulatory Visit: Payer: Self-pay | Admitting: Internal Medicine

## 2019-03-03 NOTE — Progress Notes (Signed)
Subjective:    Patient ID: Angelica Patton, female    DOB: 02/17/1951, 68 y.o.   MRN: [id number]  HPI She is here for a physical exam.   Left upper arm pain- feels like intermuscular pain.  No pattern to pain.  Can feel it if lifts up arm in a certain way. Started > 6 weeks ago.  The pain comes and goes.  She denies any major injuries.  Tingling-stinging pain in MCP joints - thumbs, and in arms.  ? Arthritis.  She has tried tumeric.  She was unsure if the tingling and stinging sensation in her arms was anxiety.  She has had some increased stress.  Her and her [place] to the beach at some point.  She is also had other things going on, but overall feels her medications are working well and helping with her anxiety.  hyperhydrosis of head with exertion.  She has had profuse head sweating with exertion and when it is hot out.  She has had this for awhile, but it is very different.    Snoring terribly.  Never wakes up in middle of night or gasp for air.  No witnessed apnea.  She generally does not feel tired during the day.  She is unsure if she wants to have the sleep apnea test or not.  Her husband has sleep apnea and thinks she may have it as well.   Medications and allergies reviewed with patient and updated if appropriate.  Patient Active Problem List   Diagnosis Date Noted  . History of pneumonia 07/03/2016  . Anxiety 07/03/2016  . Allergic rhinitis 07/03/2016  . History of breast cancer 07/03/2016  . Osteoporosis 05/23/2016  . Depression   . Breast cancer [facility])     Current Outpatient Medications on File Prior to Visit  Medication Sig Dispense Refill  . ALPRAZolam (XANAX) 0.25 MG tablet TAKE 1 TABLET BY MOUTH AT BEDTIME AS NEEDED FOR ANXIETY 30 tablet 0  . aspirin 81 MG tablet Take 81 mg by mouth daily.    Marland Kitchen azelastine (ASTELIN) 0.1 % nasal spray PLACE 1 PUFF IN EACH NOSTRIL TWICE A DAY 30 mL 0  . cholecalciferol (VITAMIN D) 1000 UNITS tablet Take 1,000 Units by  mouth daily.    Marland Kitchen escitalopram (LEXAPRO) 20 MG tablet Take 1 tablet (20 mg total) by mouth daily. 90 tablet 3  . levocetirizine (XYZAL) 5 MG tablet Take 1 tablet (5 mg total) by mouth every evening. 90 tablet 0  . montelukast (SINGULAIR) 10 MG tablet Take 1 tablet (10 mg total) by mouth every evening. 90 tablet 0  . Multiple Vitamins-Minerals (MULTIVITAMIN PO) Take by mouth.    Marland Kitchen olopatadine (PATANOL) 0.1 % ophthalmic solution PLACE 1 DROP INTO AFFECTED EYE TWICE A DAY 5 mL 0  . Probiotic Product (ACIDOPHILUS/GOAT MILK) CAPS Take by mouth.    . valACYclovir (VALTREX) 500 MG tablet Take 1 tablet (500 mg total) by mouth as needed. 30 tablet 3   No current facility-administered medications on file prior to visit.     Past Medical History:  Diagnosis Date  . Anxiety   . Breast cancer [facility])    s/p double mastectomy 01/2010  . Depression   . Hypertension   . Osteoporosis   . STD (sexually transmitted disease)    Herpes    Past Surgical History:  Procedure Laterality Date  . COLPOSCOPY    . cone procedure  2010   unsure about date  .  double mastectomy    . KNEE SURGERY    . MASTECTOMY  01/25/2010   Bilateral     Social History   Socioeconomic History  . Marital status: Married    Spouse name: Not on file  . Number of children: Not on file  . Years of education: Not on file  . Highest education level: Not on file  Occupational History  . Occupation: Cytogeneticist  Social Needs  . Financial resource strain: Not on file  . Food insecurity:    Worry: Not on file    Inability: Not on file  . Transportation needs:    Medical: Not on file    Non-medical: Not on file  Tobacco Use  . Smoking status: Former Smoker    Years: 15.00    Types: Cigarettes    Last attempt to quit: 12/06/2012    Years since quitting: 6.2  . Smokeless tobacco: Never Used  . Tobacco comment: quit over a year ago  Substance and Sexual Activity  . Alcohol use: Yes    Alcohol/week: 5.0 standard  drinks    Types: 5 Glasses of wine per week  . Drug use: No  . Sexual activity: Yes    Partners: Male    Birth control/protection: Post-menopausal  Lifestyle  . Physical activity:    Days per week: Not on file    Minutes per session: Not on file  . Stress: Not on file  Relationships  . Social connections:    Talks on phone: Not on file    Gets together: Not on file    Attends religious service: Not on file    Active member of club or organization: Not on file    Attends meetings of clubs or organizations: Not on file    Relationship status: Not on file  Other Topics Concern  . Not on file  Social History Narrative  . Not on file    Family History  Problem Relation Age of Onset  . Ulcers Father   . Alcohol abuse Father   . Heart failure Mother   . Alcohol abuse Mother   . Heart disease Mother   . Depression Sister   . Depression Daughter   . Cancer Paternal Grandmother     Review of Systems  Constitutional: Negative for chills and fever.  Eyes: Negative for visual disturbance.  Respiratory: Negative for cough, shortness of breath and wheezing.   Cardiovascular: Negative for chest pain, palpitations and leg swelling.  Gastrointestinal: Negative for abdominal pain, blood in stool, constipation, diarrhea and nausea.       No gerd  Genitourinary: Negative for dysuria and hematuria.  Musculoskeletal: Positive for arthralgias. Negative for back pain.  Skin: Negative for color change and rash.  Neurological: Positive for headaches (ooc - weather related). Negative for dizziness and light-headedness.  Psychiatric/Behavioral: Positive for dysphoric mood. The patient is nervous/anxious.        Objective:   Vitals:   03/04/19 0921  BP: 134/88  Pulse: 83  Resp: 16  Temp: 98.6 F (37 C)  SpO2: 98%   Filed Weights   03/04/19 0921  Weight: 160 lb 12.8 oz (72.9 kg)   Body mass index is 26.35 kg/m.  BP Readings from Last 3 Encounters:  03/04/19 134/88  11/06/18  133/78  09/30/18 128/80    Wt Readings from Last 3 Encounters:  03/04/19 160 lb 12.8 oz (72.9 kg)  09/02/18 161 lb 6.4 oz (73.2 kg)  01/21/18 154 lb (69.9 kg)  Physical Exam Constitutional: She appears well-developed and well-nourished. No distress.  HENT:  Head: Normocephalic and atraumatic.  Right Ear: External ear normal. Normal ear canal and TM Left Ear: External ear normal.  Normal ear canal and TM Mouth/Throat: Oropharynx is clear and moist.  Eyes: Conjunctivae and EOM are normal.  Neck: Neck supple. No tracheal deviation present. No thyromegaly present.  No carotid bruit  Cardiovascular: Normal rate, regular rhythm and normal heart sounds.   No murmur heard.  No edema. Pulmonary/Chest: Effort normal and breath sounds normal. No respiratory distress. She has no wheezes. She has no rales.  Breast: deferred   Abdominal: Soft. She exhibits no distension. There is no tenderness. Musculoskeletal: Left upper arm pain without deformity, increased pain with movement of the shoulder in certain directions Lymphadenopathy: She has no cervical adenopathy.  Skin: Skin is warm and dry. She is not diaphoretic.  Psychiatric: She has a normal mood and affect. Her behavior is normal.        Assessment & Plan:   Physical exam: Screening blood work ordered Immunizations   Pneumovax today, tdap discussed, all others up to date Colonoscopy   Up to date  -- due in 05/2019 Mammogram  N/A Dexa   Up to date  Eye exams  Up to date  Exercise very active Weight-BMI good for age Skin   no concerns Substance abuse   none  See Problem List for Assessment and Plan of chronic medical problems.   Follow-up in 1 year

## 2019-03-04 ENCOUNTER — Other Ambulatory Visit (INDEPENDENT_AMBULATORY_CARE_PROVIDER_SITE_OTHER): Payer: No Typology Code available for payment source

## 2019-03-04 ENCOUNTER — Encounter: Payer: Self-pay | Admitting: Internal Medicine

## 2019-03-04 ENCOUNTER — Ambulatory Visit (INDEPENDENT_AMBULATORY_CARE_PROVIDER_SITE_OTHER): Payer: No Typology Code available for payment source | Admitting: Internal Medicine

## 2019-03-04 ENCOUNTER — Telehealth: Payer: Self-pay

## 2019-03-04 ENCOUNTER — Other Ambulatory Visit: Payer: Self-pay

## 2019-03-04 VITALS — BP 134/88 | HR 83 | Temp 98.6°F | Resp 16 | Ht 65.5 in | Wt 160.8 lb

## 2019-03-04 DIAGNOSIS — R739 Hyperglycemia, unspecified: Secondary | ICD-10-CM

## 2019-03-04 DIAGNOSIS — Z23 Encounter for immunization: Secondary | ICD-10-CM

## 2019-03-04 DIAGNOSIS — F419 Anxiety disorder, unspecified: Secondary | ICD-10-CM

## 2019-03-04 DIAGNOSIS — Z0000 Encounter for general adult medical examination without abnormal findings: Secondary | ICD-10-CM | POA: Diagnosis not present

## 2019-03-04 DIAGNOSIS — F329 Major depressive disorder, single episode, unspecified: Secondary | ICD-10-CM

## 2019-03-04 DIAGNOSIS — M810 Age-related osteoporosis without current pathological fracture: Secondary | ICD-10-CM

## 2019-03-04 DIAGNOSIS — F32A Depression, unspecified: Secondary | ICD-10-CM

## 2019-03-04 LAB — COMPREHENSIVE METABOLIC PANEL
ALT: 60 U/L — ABNORMAL HIGH (ref 0–35)
AST: 34 U/L (ref 0–37)
Albumin: 4.7 g/dL (ref 3.5–5.2)
Alkaline Phosphatase: 55 U/L (ref 39–117)
BUN: 17 mg/dL (ref 6–23)
CO2: 25 mEq/L (ref 19–32)
Calcium: 9.6 mg/dL (ref 8.4–10.5)
Chloride: 103 mEq/L (ref 96–112)
Creatinine, Ser: 0.71 mg/dL (ref 0.40–1.20)
GFR: 82.3 mL/min (ref >60.00)
Glucose, Bld: 103 mg/dL — ABNORMAL HIGH (ref 70–99)
Potassium: 4.2 mEq/L (ref 3.5–5.1)
Sodium: 138 mEq/L (ref 135–145)
Total Bilirubin: 0.5 mg/dL (ref 0.2–1.2)
Total Protein: 7.1 g/dL (ref 6.0–8.3)

## 2019-03-04 LAB — TSH: TSH: 3.6 u[IU]/mL (ref 0.35–4.50)

## 2019-03-04 LAB — LIPID PANEL
Cholesterol: 209 mg/dL — ABNORMAL HIGH (ref 0–200)
HDL: 90.5 mg/dL (ref >39.00)
LDL Cholesterol: 110 mg/dL — ABNORMAL HIGH (ref 0–99)
NonHDL: 118.23
Total CHOL/HDL Ratio: 2
Triglycerides: 43 mg/dL (ref 0.0–149.0)
VLDL: 8.6 mg/dL (ref 0.0–40.0)

## 2019-03-04 LAB — HEMOGLOBIN A1C: Hgb A1c MFr Bld: 5.7 % (ref 4.6–6.5)

## 2019-03-04 LAB — CBC WITH DIFFERENTIAL/PLATELET
Basophils Absolute: 0.1 10*3/uL (ref 0.0–0.1)
Basophils Relative: 0.9 % (ref 0.0–3.0)
Eosinophils Absolute: 0.1 10*3/uL (ref 0.0–0.7)
Eosinophils Relative: 1.5 % (ref 0.0–5.0)
HCT: 43.3 % (ref 36.0–46.0)
Hemoglobin: 14.3 g/dL (ref 12.0–15.0)
Lymphocytes Relative: 35.8 % (ref 12.0–46.0)
Lymphs Abs: 2.4 10*3/uL (ref 0.7–4.0)
MCHC: 33 g/dL (ref 30.0–36.0)
MCV: 94.1 fl (ref 78.0–100.0)
Monocytes Absolute: 0.7 10*3/uL (ref 0.1–1.0)
Monocytes Relative: 10.2 % (ref 3.0–12.0)
Neutro Abs: 3.5 10*3/uL (ref 1.4–7.7)
Neutrophils Relative %: 51.6 % (ref 43.0–77.0)
Platelets: 257 10*3/uL (ref 150.0–400.0)
RBC: 4.6 Mil/uL (ref 3.87–5.11)
RDW: 12.9 % (ref 11.5–15.5)
WBC: 6.7 10*3/uL (ref 4.0–10.5)

## 2019-03-04 NOTE — Patient Instructions (Signed)
Tests ordered today. Your results will be released to [place] (or called to you) after review, usually within 72hours after test completion. If any changes need to be made, you will be notified at that same time.  All other Health Maintenance issues reviewed.   All recommended immunizations and age-appropriate screenings are up-to-date or discussed.  prevnar immunization administered today.   Medications reviewed and updated.  Changes include :  none     Please followup in one year   Health Maintenance, Female Adopting a healthy lifestyle and getting preventive care can go a long way to promote health and wellness. Talk with your health care provider about what schedule of regular examinations is right for you. This is a good chance for you to check in with your provider about disease prevention and staying healthy. In between checkups, there are plenty of things you can do on your own. Experts have done a lot of research about which lifestyle changes and preventive measures are most likely to keep you healthy. Ask your health care provider for more information. Weight and diet Eat a healthy diet  Be sure to include plenty of vegetables, fruits, low-fat dairy products, and lean protein.  Do not eat a lot of foods high in solid fats, added sugars, or salt.  Get regular exercise. This is one of the most important things you can do for your health. ? Most adults should exercise for at least 150 minutes each week. The exercise should increase your heart rate and make you sweat (moderate-intensity exercise). ? Most adults should also do strengthening exercises at least twice a week. This is in addition to the moderate-intensity exercise. Maintain a healthy weight  Body mass index (BMI) is a measurement that can be used to identify possible weight problems. It estimates body fat based on height and weight. Your health care provider can help determine your BMI and help you achieve or maintain  a healthy weight.  For females 1 years of age and older: ? A BMI below 18.5 is considered underweight. ? A BMI of 18.5 to 24.9 is normal. ? A BMI of 25 to 29.9 is considered overweight. ? A BMI of 30 and above is considered obese. Watch levels of cholesterol and blood lipids  You should start having your blood tested for lipids and cholesterol at 66 years of age, then have this test every 5 years.  You may need to have your cholesterol levels checked more often if: ? Your lipid or cholesterol levels are high. ? You are older than 66 years of age. ? You are at high risk for heart disease. Cancer screening Lung Cancer  Lung cancer screening is recommended for adults 89-30 years old who are at high risk for lung cancer because of a history of smoking.  A yearly low-dose CT scan of the lungs is recommended for people who: ? Currently smoke. ? Have quit within the past 15 years. ? Have at least a 30-pack-year history of smoking. A pack year is smoking an average of one pack of cigarettes a day for 1 year.  Yearly screening should continue until it has been 15 years since you quit.  Yearly screening should stop if you develop a health problem that would prevent you from having lung cancer treatment. Breast Cancer  Practice breast self-awareness. This means understanding how your breasts normally appear and feel.  It also means doing regular breast self-exams. Let your health care provider know about any changes, no matter how  If you are in your 20s or 30s, you should have a clinical breast exam (CBE) by a health care provider every 1-3 years as part of a regular health exam.  If you are 40 or older, have a CBE every year. Also consider having a breast X-ray (mammogram) every year.  If you have a family history of breast cancer, talk to your health care provider about genetic screening.  If you are at high risk for breast cancer, talk to your health care provider about having  an MRI and a mammogram every year.  Breast cancer gene (BRCA) assessment is recommended for women who have family members with BRCA-related cancers. BRCA-related cancers include: ? Breast. ? Ovarian. ? Tubal. ? Peritoneal cancers.  Results of the assessment will determine the need for genetic counseling and BRCA1 and BRCA2 testing. Cervical Cancer Your health care provider may recommend that you be screened regularly for cancer of the pelvic organs (ovaries, uterus, and vagina). This screening involves a pelvic examination, including checking for microscopic changes to the surface of your cervix (Pap test). You may be encouraged to have this screening done every 3 years, beginning at age 21.  For women ages 30-65, health care providers may recommend pelvic exams and Pap testing every 3 years, or they may recommend the Pap and pelvic exam, combined with testing for human papilloma virus (HPV), every 5 years. Some types of HPV increase your risk of cervical cancer. Testing for HPV may also be done on women of any age with unclear Pap test results.  Other health care providers may not recommend any screening for nonpregnant women who are considered low risk for pelvic cancer and who do not have symptoms. Ask your health care provider if a screening pelvic exam is right for you.  If you have had past treatment for cervical cancer or a condition that could lead to cancer, you need Pap tests and screening for cancer for at least 20 years after your treatment. If Pap tests have been discontinued, your risk factors (such as having a new sexual partner) need to be reassessed to determine if screening should resume. Some women have medical problems that increase the chance of getting cervical cancer. In these cases, your health care provider may recommend more frequent screening and Pap tests. Colorectal Cancer  This type of cancer can be detected and often prevented.  Routine colorectal cancer screening  usually begins at 66 years of age and continues through 66 years of age.  Your health care provider may recommend screening at an earlier age if you have risk factors for colon cancer.  Your health care provider may also recommend using home test kits to check for hidden blood in the stool.  A small camera at the end of a tube can be used to examine your colon directly (sigmoidoscopy or colonoscopy). This is done to check for the earliest forms of colorectal cancer.  Routine screening usually begins at age 50.  Direct examination of the colon should be repeated every 5-10 years through 66 years of age. However, you may need to be screened more often if early forms of precancerous polyps or small growths are found. Skin Cancer  Check your skin from head to toe regularly.  Tell your health care provider about any new moles or changes in moles, especially if there is a change in a mole's shape or color.  Also tell your health care provider if you have a mole that is larger than the   size of a pencil eraser.  Always use sunscreen. Apply sunscreen liberally and repeatedly throughout the day.  Protect yourself by wearing long sleeves, pants, a wide-brimmed hat, and sunglasses whenever you are outside. Heart disease, diabetes, and high blood pressure  High blood pressure causes heart disease and increases the risk of stroke. High blood pressure is more likely to develop in: ? People who have blood pressure in the high end of the normal range (130-139/85-89 mm Hg). ? People who are overweight or obese. ? People who are African American.  If you are 18-39 years of age, have your blood pressure checked every 3-5 years. If you are 40 years of age or older, have your blood pressure checked every year. You should have your blood pressure measured twice-once when you are at a hospital or clinic, and once when you are not at a hospital or clinic. Record the average of the two measurements. To check your  blood pressure when you are not at a hospital or clinic, you can use: ? An automated blood pressure machine at a pharmacy. ? A home blood pressure monitor.  If you are between 55 years and 79 years old, ask your health care provider if you should take aspirin to prevent strokes.  Have regular diabetes screenings. This involves taking a blood sample to check your fasting blood sugar level. ? If you are at a normal weight and have a low risk for diabetes, have this test once every three years after 66 years of age. ? If you are overweight and have a high risk for diabetes, consider being tested at a younger age or more often. Preventing infection Hepatitis B  If you have a higher risk for hepatitis B, you should be screened for this virus. You are considered at high risk for hepatitis B if: ? You were born in a country where hepatitis B is common. Ask your health care provider which countries are considered high risk. ? Your parents were born in a high-risk country, and you have not been immunized against hepatitis B (hepatitis B vaccine). ? You have HIV or AIDS. ? You use needles to inject street drugs. ? You live with someone who has hepatitis B. ? You have had sex with someone who has hepatitis B. ? You get hemodialysis treatment. ? You take certain medicines for conditions, including cancer, organ transplantation, and autoimmune conditions. Hepatitis C  Blood testing is recommended for: ? Everyone born from 1945 through 1965. ? Anyone with known risk factors for hepatitis C. Sexually transmitted infections (STIs)  You should be screened for sexually transmitted infections (STIs) including gonorrhea and chlamydia if: ? You are sexually active and are younger than 66 years of age. ? You are older than 66 years of age and your health care provider tells you that you are at risk for this type of infection. ? Your sexual activity has changed since you were last screened and you are at an  increased risk for chlamydia or gonorrhea. Ask your health care provider if you are at risk.  If you do not have HIV, but are at risk, it may be recommended that you take a prescription medicine daily to prevent HIV infection. This is called pre-exposure prophylaxis (PrEP). You are considered at risk if: ? You are sexually active and do not regularly use condoms or know the HIV status of your partner(s). ? You take drugs by injection. ? You are sexually active with a partner who has HIV.   has HIV. Talk with your health care provider about whether you are at high risk of being infected with HIV. If you choose to begin PrEP, you should first be tested for HIV. You should then be tested every 3 months for as long as you are taking PrEP. Pregnancy  If you are premenopausal and you may become pregnant, ask your health care provider about preconception counseling.  If you may become pregnant, take 400 to 800 micrograms (mcg) of folic acid every day.  If you want to prevent pregnancy, talk to your health care provider about birth control (contraception). Osteoporosis and menopause  Osteoporosis is a disease in which the bones lose minerals and strength with aging. This can result in serious bone fractures. Your risk for osteoporosis can be identified using a bone density scan.  If you are 32 years of age or older, or if you are at risk for osteoporosis and fractures, ask your health care provider if you should be screened.  Ask your health care provider whether you should take a calcium or vitamin D supplement to lower your risk for osteoporosis.  Menopause may have certain physical symptoms and risks.  Hormone replacement therapy may reduce some of these symptoms and risks. Talk to your health care provider about whether hormone replacement therapy is right for you. Follow these instructions at home:  Schedule regular health, dental, and eye exams.  Stay current with your immunizations.  Do  not use any tobacco products including cigarettes, chewing tobacco, or electronic cigarettes.  If you are pregnant, do not drink alcohol.  If you are breastfeeding, limit how much and how often you drink alcohol.  Limit alcohol intake to no more than 1 drink per day for nonpregnant women. One drink equals 12 ounces of beer, 5 ounces of wine, or 1 ounces of hard liquor.  Do not use street drugs.  Do not share needles.  Ask your health care provider for help if you need support or information about quitting drugs.  Tell your health care provider if you often feel depressed.  Tell your health care provider if you have ever been abused or do not feel safe at home. This information is not intended to replace advice given to you by your health care provider. Make sure you discuss any questions you have with your health care provider. Document Released: 03/27/2011 Document Revised: 02/17/2016 Document Reviewed: 06/15/2015 Elsevier Interactive Patient Education  2019 [employer].

## 2019-03-04 NOTE — Assessment & Plan Note (Signed)
Controlled, stable Continue current dose of medication  

## 2019-03-04 NOTE — Telephone Encounter (Signed)
Explained to patient that we do not do blood type testing here. I did tell her she can go to the [facility] and donate and they can tell her. Pt expressed understanding.

## 2019-03-04 NOTE — Assessment & Plan Note (Signed)
Has been on Fosamax in the past and wants to avoid additional medications Last DEXA 07/2018 showed severe osteopenia Continue increased activity/exercise Continue vitamin D

## 2019-03-04 NOTE — Assessment & Plan Note (Signed)
Controlled, stable Continue current dose of medication-Lexapro 20 mg daily, Xanax as needed

## 2019-03-04 NOTE — Telephone Encounter (Signed)
Copied from [place] [id number]. Topic: General - Other >> Mar 04, 2019  1:22 PM Sheran Luz wrote: Patient requesting call back from [facility] regarding lab work done today. She inquired if it would be possible to get her blood type from sample provided.

## 2019-03-05 ENCOUNTER — Encounter: Payer: Self-pay | Admitting: Internal Medicine

## 2019-03-05 DIAGNOSIS — R7303 Prediabetes: Secondary | ICD-10-CM | POA: Insufficient documentation

## 2019-04-02 ENCOUNTER — Other Ambulatory Visit: Payer: Self-pay | Admitting: Internal Medicine

## 2019-04-03 NOTE — Telephone Encounter (Signed)
[place] Controlled Database Checked Last filled: 07/16/17 # 30 LOV w/you: 03/04/19 Next appt w/you: None

## 2019-04-29 ENCOUNTER — Encounter: Payer: Self-pay | Admitting: Internal Medicine

## 2019-04-29 DIAGNOSIS — M25519 Pain in unspecified shoulder: Secondary | ICD-10-CM

## 2019-09-02 ENCOUNTER — Other Ambulatory Visit: Payer: Self-pay | Admitting: Internal Medicine

## 2019-09-03 MED ORDER — OLOPATADINE HCL 0.1 % OP SOLN: OPHTHALMIC | 0 refills | Status: DC

## 2019-09-03 MED ORDER — MONTELUKAST SODIUM 10 MG PO TABS: 10.0000 mg | ORAL_TABLET | Freq: Every evening | ORAL | 0 refills | Status: DC

## 2019-09-03 MED ORDER — AZELASTINE HCL 0.1 % NA SOLN: NASAL | 0 refills | Status: DC

## 2019-10-02 ENCOUNTER — Ambulatory Visit: Payer: Self-pay | Admitting: Obstetrics & Gynecology

## 2019-10-30 ENCOUNTER — Encounter: Payer: Self-pay | Admitting: Internal Medicine

## 2019-11-24 ENCOUNTER — Ambulatory Visit: Payer: No Typology Code available for payment source | Attending: Internal Medicine

## 2019-11-24 DIAGNOSIS — Z23 Encounter for immunization: Secondary | ICD-10-CM | POA: Insufficient documentation

## 2019-11-24 NOTE — Progress Notes (Signed)
   Covid-19 Vaccination Clinic  Name:  Angelica Patton    MRN: [id number] DOB: 07/19/1951  11/24/2019  [name] was observed post Covid-19 immunization for 15 minutes without incidence. She was provided with Vaccine Information Sheet and instruction to access the V-Safe system.   [name] was instructed to call 911 with any severe reactions post vaccine: Marland Kitchen Difficulty breathing  . Swelling of your face and throat  . A fast heartbeat  . A bad rash all over your body  . Dizziness and weakness    Immunizations Administered    Name Date Dose VIS Date Route   Pfizer COVID-19 Vaccine 11/24/2019 11:36 AM 0.3 mL 09/05/2019 Intramuscular   Manufacturer: [place]   Lot: [id number]   [place]: [id number]

## 2019-12-16 ENCOUNTER — Ambulatory Visit: Payer: No Typology Code available for payment source | Attending: Internal Medicine

## 2019-12-16 DIAGNOSIS — Z23 Encounter for immunization: Secondary | ICD-10-CM

## 2019-12-16 NOTE — Progress Notes (Signed)
   Covid-19 Vaccination Clinic  Name:  Angelica Patton    MRN: [id number] DOB: 1951/07/10  12/16/2019  Angelica Patton was observed post Covid-19 immunization for 15 minutes without incident. She was provided with Vaccine Information Sheet and instruction to access the V-Safe system.   Angelica Patton was instructed to call 911 with any severe reactions post vaccine: Marland Kitchen Difficulty breathing  . Swelling of face and throat  . A fast heartbeat  . A bad rash all over body  . Dizziness and weakness   Immunizations Administered    Name Date Dose VIS Date Route   Pfizer COVID-19 Vaccine 12/16/2019 11:48 AM 0.3 mL 09/05/2019 Intramuscular   Manufacturer: [place]   Lot: [id number]   [place]: [id number]

## 2019-12-23 ENCOUNTER — Other Ambulatory Visit: Payer: Self-pay | Admitting: Internal Medicine

## 2019-12-30 ENCOUNTER — Telehealth: Payer: No Typology Code available for payment source | Admitting: Physician Assistant

## 2019-12-30 DIAGNOSIS — R197 Diarrhea, unspecified: Secondary | ICD-10-CM

## 2019-12-30 NOTE — Progress Notes (Signed)
Good morning Angelica Patton,  I am sorry you are not feeling well.  You will need to contact the office of Dr. Quay Burow in order to schedule an appointment with her.  You have connected with [facility] E-visit provider pool. I hope you feel better soon.

## 2019-12-30 NOTE — Progress Notes (Signed)
Virtual Visit via Video Note  I connected with Angelica Patton on 12/31/19 at  9:45 AM EDT by a video enabled telemedicine application and verified that I am speaking with the correct person using two identifiers.   I discussed the limitations of evaluation and management by telemedicine and the availability of in person appointments. The patient expressed understanding and agreed to proceed.  Present for the visit:  Myself, Dr Billey Gosling, Angelica Patton.  The patient is currently on the beach and I am in the office.    No referring provider.    History of Present Illness: This is an acute visit for difficulty holding bowel and bladder when walking long distances.   She is on optavia and has lost 8 lbs. her symptoms started before she went on this diet.  When she started this diet it was advised that she stop taking her probiotics, which she did.  Initially with this diet she was having 5-6 bowel movements, but that has lessened.  She denies any diarrhea.  Her concern is when she has softer stool and she goes for a long walk on the beach if she needs to have a bowel movement she does not have control of the stool or even her bladder at times.  She does wear a pad for that reason.  If the stool is firm she has no difficulty controlling it.  She just wants to be able to walk on the beach I do not feel uncomfortable or unsure about being able to make it to the bathroom.  Several days ago she did add back her original probiotic to see if that would help.  The diet foods contain probiotics, which is why they advised that she stop hers when she started the diet.  No recent antibiotics.    Social History   Socioeconomic History  . Marital status: Married    Spouse name: Not on file  . Number of children: Not on file  . Years of education: Not on file  . Highest education level: Not on file  Occupational History  . Occupation: Cytogeneticist  Tobacco Use  . Smoking status: Former Smoker   Years: 15.00    Types: Cigarettes    Quit date: 12/06/2012    Years since quitting: 7.0  . Smokeless tobacco: Never Used  . Tobacco comment: quit over a year ago  Substance and Sexual Activity  . Alcohol use: Yes    Alcohol/week: 5.0 standard drinks    Types: 5 Glasses of wine per week  . Drug use: No  . Sexual activity: Yes    Partners: Male    Birth control/protection: Post-menopausal  Other Topics Concern  . Not on file  Social History Narrative  . Not on file   Social Determinants of Health   Financial Resource Strain:   . Difficulty of Paying Living Expenses:   Food Insecurity:   . Worried About Charity fundraiser in the Last Year:   . Arboriculturist in the Last Year:   Transportation Needs:   . Film/video editor (Medical):   Marland Kitchen Lack of Transportation (Non-Medical):   Physical Activity:   . Days of Exercise per Week:   . Minutes of Exercise per Session:   Stress:   . Feeling of Stress :   Social Connections:   . Frequency of Communication with Friends and Family:   . Frequency of Social Gatherings with Friends and Family:   . Attends Religious Services:   .  Active Member of Clubs or Organizations:   . Attends Archivist Meetings:   Marland Kitchen Marital Status:      Observations/Objective: Appears well in NAD   Assessment and Plan:  See Problem List for Assessment and Plan of chronic medical problems.   Follow Up Instructions:    I discussed the assessment and treatment plan with the patient. The patient was provided an opportunity to ask questions and all were answered. The patient agreed with the plan and demonstrated an understanding of the instructions.   The patient was advised to call back or seek an in-person evaluation if the symptoms worsen or if the condition fails to improve as anticipated.    Binnie Rail, MD

## 2019-12-31 ENCOUNTER — Encounter: Payer: Self-pay | Admitting: Internal Medicine

## 2019-12-31 ENCOUNTER — Ambulatory Visit (INDEPENDENT_AMBULATORY_CARE_PROVIDER_SITE_OTHER): Payer: No Typology Code available for payment source | Admitting: Internal Medicine

## 2019-12-31 DIAGNOSIS — R195 Other fecal abnormalities: Secondary | ICD-10-CM | POA: Insufficient documentation

## 2019-12-31 NOTE — Assessment & Plan Note (Signed)
Acute She has had a change in stool-softer and consistently more frequent, but no diarrhea and no concerning symptoms including blood in stool, abdominal pain or fever The concern is that she has less control and sometimes has leakage She has restarted her probiotic and I would advise that she continue that Discussed that she may also need to try Metamucil or increase her fiber intake, which will hopefully help bulk up her stool and give her more control She will try this and if this does not help she will let me know Discussed that we can consider pelvic PT if this does not help, but I do think this will help

## 2020-02-09 ENCOUNTER — Other Ambulatory Visit: Payer: Self-pay | Admitting: Internal Medicine

## 2020-03-06 ENCOUNTER — Other Ambulatory Visit: Payer: Self-pay | Admitting: Internal Medicine

## 2020-04-03 ENCOUNTER — Other Ambulatory Visit: Payer: Self-pay | Admitting: Internal Medicine

## 2020-04-05 ENCOUNTER — Telehealth: Payer: Self-pay | Admitting: Internal Medicine

## 2020-04-05 NOTE — Telephone Encounter (Signed)
  Seeking advice  Patient calling for advice for eye irritation. She has been using eye drops for over 10 days and eyes still feel irritated.

## 2020-04-05 NOTE — Telephone Encounter (Signed)
She should see her eye doctor if otc drops are not helping

## 2020-04-06 ENCOUNTER — Encounter: Payer: Self-pay | Admitting: Internal Medicine

## 2020-04-06 ENCOUNTER — Telehealth: Payer: Self-pay | Admitting: Family

## 2020-04-06 NOTE — Telephone Encounter (Signed)
She can see someone here, but I do not want to waste her time here - should establish with an opthalmoloigst

## 2020-04-06 NOTE — Telephone Encounter (Signed)
I see that she is scheduled for a virtual visit about her persistent eye infection. She really needs to see an eye doctor in person- there is going to be very little that can be done on a virtual visit. I am happy to talk to her tomorrow but I want her to be aware of the limitations of this type of visit.

## 2020-04-07 ENCOUNTER — Encounter: Payer: Self-pay | Admitting: Internal Medicine

## 2020-04-07 ENCOUNTER — Telehealth: Payer: No Typology Code available for payment source | Admitting: Family

## 2020-04-07 NOTE — Telephone Encounter (Signed)
Spoke with patient today. 

## 2020-04-11 ENCOUNTER — Encounter: Payer: Self-pay | Admitting: Internal Medicine

## 2020-05-06 ENCOUNTER — Other Ambulatory Visit: Payer: Self-pay | Admitting: Internal Medicine

## 2020-06-13 NOTE — Progress Notes (Signed)
Subjective:    Patient ID: Angelica Patton, female    DOB: 1951/07/02, 69 y.o.   MRN: [id number]  HPI She is here for a physical exam.   Send meds  Medications and allergies reviewed with patient and updated if appropriate.  Patient Active Problem List   Diagnosis Date Noted  . Change in stool 12/31/2019  . Prediabetes 03/05/2019  . Anxiety 07/03/2016  . Allergic rhinitis 07/03/2016  . History of breast cancer 07/03/2016  . Osteoporosis 05/23/2016  . Depression   . Breast cancer [facility])     Current Outpatient Medications on File Prior to Visit  Medication Sig Dispense Refill  . ALPRAZolam (XANAX) 0.25 MG tablet TAKE 1 TABLET BY MOUTH AT BEDTIME AS NEEDED FOR ANXIETY 30 tablet 0  . aspirin 81 MG tablet Take 81 mg by mouth daily.    Marland Kitchen azelastine (ASTELIN) 0.1 % nasal spray Use in each nostril as directed 30 mL 0  . cholecalciferol (VITAMIN D) 1000 UNITS tablet Take 1,000 Units by mouth daily.    Marland Kitchen escitalopram (LEXAPRO) 20 MG tablet TAKE 1 TABLET BY MOUTH EVERY DAY **NEED TO MAKE AN APPOINTMENT FOR REFILLS** 90 tablet 1  . levocetirizine (XYZAL) 5 MG tablet TAKE 1 TABLET IN THE EVENING 30 tablet 2  . montelukast (SINGULAIR) 10 MG tablet Take 1 tablet (10 mg total) by mouth every evening. 90 tablet 0  . Multiple Vitamins-Minerals (MULTIVITAMIN PO) Take by mouth.    Marland Kitchen olopatadine (PATANOL) 0.1 % ophthalmic solution PLACE 1 DROP INTO AFFECTED EYE TWICE A DAY 5 mL 0  . Probiotic Product (ACIDOPHILUS/GOAT MILK) CAPS Take by mouth.    . valACYclovir (VALTREX) 500 MG tablet Take 1 tablet (500 mg total) by mouth as needed. 30 tablet 3   No current facility-administered medications on file prior to visit.    Past Medical History:  Diagnosis Date  . Anxiety   . Breast cancer [facility])    s/p double mastectomy 01/2010  . Depression   . Hypertension   . Osteoporosis   . STD (sexually transmitted disease)    Herpes    Past Surgical History:  Procedure Laterality Date  . COLPOSCOPY     . cone procedure  2010   unsure about date  . double mastectomy    . KNEE SURGERY    . MASTECTOMY  01/25/2010   Bilateral     Social History   Socioeconomic History  . Marital status: Married    Spouse name: Not on file  . Number of children: Not on file  . Years of education: Not on file  . Highest education level: Not on file  Occupational History  . Occupation: Cytogeneticist  Tobacco Use  . Smoking status: Former Smoker    Years: 15.00    Types: Cigarettes    Quit date: 12/06/2012    Years since quitting: 7.5  . Smokeless tobacco: Never Used  . Tobacco comment: quit over a year ago  Vaping Use  . Vaping Use: Never used  Substance and Sexual Activity  . Alcohol use: Yes    Alcohol/week: 5.0 standard drinks    Types: 5 Glasses of wine per week  . Drug use: No  . Sexual activity: Yes    Partners: Male    Birth control/protection: Post-menopausal  Other Topics Concern  . Not on file  Social History Narrative  . Not on file   Social Determinants of Health   Financial Resource Strain:   . Difficulty  of Paying Living Expenses: Not on file  Food Insecurity:   . Worried About Charity fundraiser in the Last Year: Not on file  . Ran Out of Food in the Last Year: Not on file  Transportation Needs:   . Lack of Transportation (Medical): Not on file  . Lack of Transportation (Non-Medical): Not on file  Physical Activity:   . Days of Exercise per Week: Not on file  . Minutes of Exercise per Session: Not on file  Stress:   . Feeling of Stress : Not on file  Social Connections:   . Frequency of Communication with Friends and Family: Not on file  . Frequency of Social Gatherings with Friends and Family: Not on file  . Attends Religious Services: Not on file  . Active Member of Clubs or Organizations: Not on file  . Attends Archivist Meetings: Not on file  . Marital Status: Not on file    Family History  Problem Relation Age of Onset  . Ulcers Father    . Alcohol abuse Father   . Heart failure Mother   . Alcohol abuse Mother   . Heart disease Mother   . Depression Sister   . Depression Daughter   . Cancer Paternal Grandmother     Review of Systems  Constitutional: Negative for chills and fever.  Eyes: Negative for visual disturbance.  Respiratory: Negative for cough, shortness of breath and wheezing.   Cardiovascular: Negative for chest pain, palpitations and leg swelling.  Gastrointestinal: Negative for abdominal pain, blood in stool, constipation, diarrhea (loose stools) and nausea.       No gerd  Genitourinary: Negative for dysuria and hematuria.  Musculoskeletal: Positive for arthralgias (Left shoulder), back pain (occ) and neck pain (occ).  Skin: Negative for rash.  Neurological: Negative for light-headedness and headaches.  Psychiatric/Behavioral: Positive for dysphoric mood. The patient is nervous/anxious.        Objective:   Vitals:   06/14/20 1540  BP: 120/72  Pulse: 87  Temp: 98.3 F (36.8 C)  SpO2: 96%   Filed Weights   06/14/20 1540  Weight: 142 lb 12.8 oz (64.8 kg)   Body mass index is 23.4 kg/m.  BP Readings from Last 3 Encounters:  06/14/20 120/72  03/04/19 134/88  11/06/18 133/78    Wt Readings from Last 3 Encounters:  06/14/20 142 lb 12.8 oz (64.8 kg)  03/04/19 160 lb 12.8 oz (72.9 kg)  09/02/18 161 lb 6.4 oz (73.2 kg)     Physical Exam Constitutional: She appears well-developed and well-nourished. No distress.  HENT:  Head: Normocephalic and atraumatic.  Right Ear: External ear normal. Normal ear canal and TM Left Ear: External ear normal.  Normal ear canal and TM Mouth/Throat: Oropharynx is clear and moist.  Eyes: Conjunctivae and EOM are normal.  Neck: Neck supple. No tracheal deviation present. No thyromegaly present.  No carotid bruit  Cardiovascular: Normal rate, regular rhythm and normal heart sounds.   No murmur heard.  No edema. Pulmonary/Chest: Effort normal and breath  sounds normal. No respiratory distress. She has no wheezes. She has no rales.  Breast: deferred   Abdominal: Soft. She exhibits no distension. There is no tenderness.  Lymphadenopathy: She has no cervical adenopathy.  Skin: Skin is warm and dry. She is not diaphoretic.  Psychiatric: She has a normal mood and affect. Her behavior is normal.        Assessment & Plan:   Physical exam: Screening blood work  ordered Immunizations  Flu vaccine today,  Discussed td, covid booster Colonoscopy  Up to date  Mammogram  N/a  - s/p b/l mastectomy Gyn   - Dr Sabra Heck.   Dexa  Up to date - due in two months.  Eye exams  Up to date  Exercise  Rides bike - 4-6 days Weight  Normal  Substance abuse   none Sees derm    See Problem List for Assessment and Plan of chronic medical problems.   This visit occurred during the SARS-CoV-2 public health emergency.  Safety protocols were in place, including screening questions prior to the visit, additional usage of staff PPE, and extensive cleaning of exam room while observing appropriate contact time as indicated for disinfecting solutions.

## 2020-06-13 NOTE — Patient Instructions (Addendum)
Blood work was ordered.    All other Health Maintenance issues reviewed.   All recommended immunizations and age-appropriate screenings are up-to-date or discussed.  Flu immunization administered today.   Medications reviewed and updated.  Changes include :   none  Your prescription(s) have been submitted to your pharmacy. Please take as directed and contact our office if you believe you are having problem(s) with the medication(s).    Please followup in 1 year   Health Maintenance, Female Adopting a healthy lifestyle and getting preventive care are important in promoting health and wellness. Ask your health care provider about:  The right schedule for you to have regular tests and exams.  Things you can do on your own to prevent diseases and keep yourself healthy. What should I know about diet, weight, and exercise? Eat a healthy diet   Eat a diet that includes plenty of vegetables, fruits, low-fat dairy products, and lean protein.  Do not eat a lot of foods that are high in solid fats, added sugars, or sodium. Maintain a healthy weight Body mass index (BMI) is used to identify weight problems. It estimates body fat based on height and weight. Your health care provider can help determine your BMI and help you achieve or maintain a healthy weight. Get regular exercise Get regular exercise. This is one of the most important things you can do for your health. Most adults should:  Exercise for at least 150 minutes each week. The exercise should increase your heart rate and make you sweat (moderate-intensity exercise).  Do strengthening exercises at least twice a week. This is in addition to the moderate-intensity exercise.  Spend less time sitting. Even light physical activity can be beneficial. Watch cholesterol and blood lipids Have your blood tested for lipids and cholesterol at 67 years of age, then have this test every 5 years. Have your cholesterol levels checked more  often if:  Your lipid or cholesterol levels are high.  You are older than 67 years of age.  You are at high risk for heart disease. What should I know about cancer screening? Depending on your health history and family history, you may need to have cancer screening at various ages. This may include screening for:  Breast cancer.  Cervical cancer.  Colorectal cancer.  Skin cancer.  Lung cancer. What should I know about heart disease, diabetes, and high blood pressure? Blood pressure and heart disease  High blood pressure causes heart disease and increases the risk of stroke. This is more likely to develop in people who have high blood pressure readings, are of African descent, or are overweight.  Have your blood pressure checked: ? Every 3-5 years if you are 18-39 years of age. ? Every year if you are 40 years old or older. Diabetes Have regular diabetes screenings. This checks your fasting blood sugar level. Have the screening done:  Once every three years after age 40 if you are at a normal weight and have a low risk for diabetes.  More often and at a younger age if you are overweight or have a high risk for diabetes. What should I know about preventing infection? Hepatitis B If you have a higher risk for hepatitis B, you should be screened for this virus. Talk with your health care provider to find out if you are at risk for hepatitis B infection. Hepatitis C Testing is recommended for:  Everyone born from 1945 through 1965.  Anyone with known risk factors for hepatitis C.   Sexually transmitted infections (STIs)  Get screened for STIs, including gonorrhea and chlamydia, if: ? You are sexually active and are younger than 67 years of age. ? You are older than 67 years of age and your health care provider tells you that you are at risk for this type of infection. ? Your sexual activity has changed since you were last screened, and you are at increased risk for chlamydia  or gonorrhea. Ask your health care provider if you are at risk.  Ask your health care provider about whether you are at high risk for HIV. Your health care provider may recommend a prescription medicine to help prevent HIV infection. If you choose to take medicine to prevent HIV, you should first get tested for HIV. You should then be tested every 3 months for as long as you are taking the medicine. Pregnancy  If you are about to stop having your period (premenopausal) and you may become pregnant, seek counseling before you get pregnant.  Take 400 to 800 micrograms (mcg) of folic acid every day if you become pregnant.  Ask for birth control (contraception) if you want to prevent pregnancy. Osteoporosis and menopause Osteoporosis is a disease in which the bones lose minerals and strength with aging. This can result in bone fractures. If you are 65 years old or older, or if you are at risk for osteoporosis and fractures, ask your health care provider if you should:  Be screened for bone loss.  Take a calcium or vitamin D supplement to lower your risk of fractures.  Be given hormone replacement therapy (HRT) to treat symptoms of menopause. Follow these instructions at home: Lifestyle  Do not use any products that contain nicotine or tobacco, such as cigarettes, e-cigarettes, and chewing tobacco. If you need help quitting, ask your health care provider.  Do not use street drugs.  Do not share needles.  Ask your health care provider for help if you need support or information about quitting drugs. Alcohol use  Do not drink alcohol if: ? Your health care provider tells you not to drink. ? You are pregnant, may be pregnant, or are planning to become pregnant.  If you drink alcohol: ? Limit how much you use to 0-1 drink a day. ? Limit intake if you are breastfeeding.  Be aware of how much alcohol is in your drink. In the [place] one drink equals one 12 oz bottle of beer (355 mL), one 5 oz  glass of wine (148 mL), or one 1 oz glass of hard liquor (44 mL). General instructions  Schedule regular health, dental, and eye exams.  Stay current with your vaccines.  Tell your health care provider if: ? You often feel depressed. ? You have ever been abused or do not feel safe at home. Summary  Adopting a healthy lifestyle and getting preventive care are important in promoting health and wellness.  Follow your health care provider's instructions about healthy diet, exercising, and getting tested or screened for diseases.  Follow your health care provider's instructions on monitoring your cholesterol and blood pressure. This information is not intended to replace advice given to you by your health care provider. Make sure you discuss any questions you have with your health care provider. Document Revised: 09/04/2018 Document Reviewed: 09/04/2018 Elsevier Patient Education  2020 Elsevier Inc.  

## 2020-06-14 ENCOUNTER — Encounter: Payer: Self-pay | Admitting: Internal Medicine

## 2020-06-14 ENCOUNTER — Other Ambulatory Visit: Payer: Self-pay

## 2020-06-14 ENCOUNTER — Ambulatory Visit (INDEPENDENT_AMBULATORY_CARE_PROVIDER_SITE_OTHER): Payer: No Typology Code available for payment source | Admitting: Internal Medicine

## 2020-06-14 VITALS — BP 120/72 | HR 87 | Temp 98.3°F | Wt 142.8 lb

## 2020-06-14 DIAGNOSIS — R7303 Prediabetes: Secondary | ICD-10-CM

## 2020-06-14 DIAGNOSIS — Z0000 Encounter for general adult medical examination without abnormal findings: Secondary | ICD-10-CM

## 2020-06-14 DIAGNOSIS — M810 Age-related osteoporosis without current pathological fracture: Secondary | ICD-10-CM

## 2020-06-14 DIAGNOSIS — Z853 Personal history of malignant neoplasm of breast: Secondary | ICD-10-CM

## 2020-06-14 DIAGNOSIS — F3289 Other specified depressive episodes: Secondary | ICD-10-CM | POA: Diagnosis not present

## 2020-06-14 DIAGNOSIS — J309 Allergic rhinitis, unspecified: Secondary | ICD-10-CM

## 2020-06-14 DIAGNOSIS — F419 Anxiety disorder, unspecified: Secondary | ICD-10-CM

## 2020-06-14 DIAGNOSIS — Z23 Encounter for immunization: Secondary | ICD-10-CM | POA: Diagnosis not present

## 2020-06-14 MED ORDER — LEVOCETIRIZINE DIHYDROCHLORIDE 5 MG PO TABS: 5.0000 mg | ORAL_TABLET | Freq: Every evening | ORAL | 5 refills | Status: DC

## 2020-06-14 MED ORDER — ESCITALOPRAM OXALATE 20 MG PO TABS: ORAL_TABLET | ORAL | 3 refills | Status: DC

## 2020-06-14 MED ORDER — TURMERIC 500 MG PO CAPS: ORAL_CAPSULE | ORAL | Status: DC

## 2020-06-14 MED ORDER — MONTELUKAST SODIUM 10 MG PO TABS
10.0000 mg | ORAL_TABLET | Freq: Every evening | ORAL | 1 refills | Status: DC
Start: 2020-06-14 — End: 2021-06-29

## 2020-06-14 NOTE — Assessment & Plan Note (Signed)
Chronic Check a1c Low sugar / carb diet Stressed regular exercise  

## 2020-06-14 NOTE — Assessment & Plan Note (Signed)
Chronic Controlled, stable Continue current dose of medication- lexapro 10 mg daily  

## 2020-06-14 NOTE — Assessment & Plan Note (Signed)
Status post bilateral mastectomy Sees Dr. Sabra Heck regularly No evidence of recurrence

## 2020-06-14 NOTE — Assessment & Plan Note (Signed)
Chronic DEXA up-to-date Exercising regularly Taking vitamin D-we will check level

## 2020-06-14 NOTE — Assessment & Plan Note (Signed)
Chronic Controlled, stable Continue current dose of medication Taking lexapro 10 mg daily Taking xanax prn

## 2020-06-14 NOTE — Assessment & Plan Note (Signed)
Chronic Controlled, stable Continue current dose of medication Takes xyzal, singulair and nasal spray prn

## 2020-06-15 LAB — COMPREHENSIVE METABOLIC PANEL
AG Ratio: 2.5 (calc) (ref 1.0–2.5)
ALT: 16 U/L (ref 6–29)
AST: 15 U/L (ref 10–35)
Albumin: 4.5 g/dL (ref 3.6–5.1)
Alkaline phosphatase (APISO): 44 U/L (ref 37–153)
BUN: 14 mg/dL (ref 7–25)
CO2: 30 mmol/L (ref 20–32)
Calcium: 9.8 mg/dL (ref 8.6–10.4)
Chloride: 106 mmol/L (ref 98–110)
Creat: 0.72 mg/dL (ref 0.50–0.99)
Globulin: 1.8 g/dL (calc) — ABNORMAL LOW (ref 1.9–3.7)
Glucose, Bld: 95 mg/dL (ref 65–99)
Potassium: 4.5 mmol/L (ref 3.5–5.3)
Sodium: 143 mmol/L (ref 135–146)
Total Bilirubin: 0.4 mg/dL (ref 0.2–1.2)
Total Protein: 6.3 g/dL (ref 6.1–8.1)

## 2020-06-15 LAB — CBC WITH DIFFERENTIAL/PLATELET
Absolute Monocytes: 818 cells/uL (ref 200–950)
Basophils Absolute: 57 cells/uL (ref 0–200)
Basophils Relative: 0.7 %
Eosinophils Absolute: 89 cells/uL (ref 15–500)
Eosinophils Relative: 1.1 %
HCT: 39.2 % (ref 35.0–45.0)
Hemoglobin: 13.2 g/dL (ref 11.7–15.5)
Lymphs Abs: 2155 cells/uL (ref 850–3900)
MCH: 31.5 pg (ref 27.0–33.0)
MCHC: 33.7 g/dL (ref 32.0–36.0)
MCV: 93.6 fL (ref 80.0–100.0)
MPV: 11.2 fL (ref 7.5–12.5)
Monocytes Relative: 10.1 %
Neutro Abs: 4982 cells/uL (ref 1500–7800)
Neutrophils Relative %: 61.5 %
Platelets: 257 10*3/uL (ref 140–400)
RBC: 4.19 10*6/uL (ref 3.80–5.10)
RDW: 12.2 % (ref 11.0–15.0)
Total Lymphocyte: 26.6 %
WBC: 8.1 10*3/uL (ref 3.8–10.8)

## 2020-06-15 LAB — LIPID PANEL
Cholesterol: 199 mg/dL (ref <200)
HDL: 105 mg/dL (ref >=50)
LDL Cholesterol (Calc): 78 mg/dL (calc)
Non-HDL Cholesterol (Calc): 94 mg/dL (calc) (ref <130)
Total CHOL/HDL Ratio: 1.9 (calc) (ref <5.0)
Triglycerides: 78 mg/dL (ref <150)

## 2020-06-15 LAB — TSH: TSH: 3.12 mIU/L (ref 0.40–4.50)

## 2020-06-15 LAB — HEMOGLOBIN A1C
Hgb A1c MFr Bld: 5.1 % of total Hgb (ref <5.7)
Mean Plasma Glucose: 100 (calc)
eAG (mmol/L): 5.5 (calc)

## 2020-06-15 LAB — VITAMIN D 25 HYDROXY (VIT D DEFICIENCY, FRACTURES): Vit D, 25-Hydroxy: 88 ng/mL (ref 30–100)

## 2020-07-01 ENCOUNTER — Ambulatory Visit: Payer: Medicare Other | Admitting: Obstetrics & Gynecology

## 2020-07-13 ENCOUNTER — Encounter: Payer: Self-pay | Admitting: Internal Medicine

## 2020-07-14 MED ORDER — VALACYCLOVIR HCL 500 MG PO TABS
500.0000 mg | ORAL_TABLET | ORAL | 3 refills | Status: DC | PRN
Start: 2020-07-14 — End: 2020-07-26

## 2020-07-14 NOTE — Addendum Note (Signed)
Addended by: Binnie Rail on: 07/14/2020 08:43 PM   Modules accepted: Orders

## 2020-07-26 MED ORDER — VALACYCLOVIR HCL 500 MG PO TABS
500.0000 mg | ORAL_TABLET | ORAL | 3 refills | Status: DC | PRN
Start: 2020-07-26 — End: 2022-12-11

## 2020-07-26 NOTE — Addendum Note (Signed)
Addended by: Binnie Rail on: 07/26/2020 07:38 AM   Modules accepted: Orders

## 2020-08-10 ENCOUNTER — Encounter: Payer: Self-pay | Admitting: Internal Medicine

## 2020-08-10 MED ORDER — ALPRAZOLAM 0.25 MG PO TABS
ORAL_TABLET | ORAL | 0 refills | Status: DC
Start: 2020-08-10 — End: 2021-06-29

## 2021-01-12 ENCOUNTER — Encounter: Payer: Self-pay | Admitting: Internal Medicine

## 2021-01-20 ENCOUNTER — Encounter: Payer: Self-pay | Admitting: Internal Medicine

## 2021-02-08 ENCOUNTER — Encounter: Payer: Self-pay | Admitting: Internal Medicine

## 2021-03-16 ENCOUNTER — Other Ambulatory Visit: Payer: Self-pay | Admitting: Internal Medicine

## 2021-04-14 ENCOUNTER — Encounter: Payer: Self-pay | Admitting: Internal Medicine

## 2021-05-04 ENCOUNTER — Encounter: Payer: Self-pay | Admitting: Internal Medicine

## 2021-05-04 DIAGNOSIS — M810 Age-related osteoporosis without current pathological fracture: Secondary | ICD-10-CM

## 2021-05-06 NOTE — Addendum Note (Signed)
Addended by: Binnie Rail on: 05/06/2021 07:30 AM   Modules accepted: Orders

## 2021-06-21 ENCOUNTER — Other Ambulatory Visit: Payer: Self-pay | Admitting: Internal Medicine

## 2021-06-28 ENCOUNTER — Encounter: Payer: Self-pay | Admitting: Internal Medicine

## 2021-06-28 NOTE — Progress Notes (Signed)
Subjective:    Patient ID: Angelica Patton, female    DOB: 1950-12-16, 70 y.o.   MRN: [id number]   This visit occurred during the SARS-CoV-2 public health emergency.  Safety protocols were in place, including screening questions prior to the visit, additional usage of staff PPE, and extensive cleaning of exam room while observing appropriate contact time as indicated for disinfecting solutions.    HPI She is here for a physical exam.   Lack of motivation-at times feels depressed or more of just a lack of motivation.  She is not sure if it is because she does not have activities [place] at the beach that motivates her or something else.  Lack of bowels at times - loose stools.  Some foods trigger it- popcorn, proteins drinks, too many greens, no abdomen pain or gas.  She takes Cuturelle probiotic.  This can occur a few times a month or just once a month.  There is no obvious cause and it does not always make sense.   Mom died from heart failure -MVR and bypass at 58    Medications and allergies reviewed with patient and updated if appropriate.  Patient Active Problem List   Diagnosis Date Noted   Change in stool 12/31/2019   Prediabetes 03/05/2019   Anxiety 07/03/2016   Allergic rhinitis 07/03/2016   History of breast cancer 07/03/2016   Osteoporosis 05/23/2016   Depression     Current Outpatient Medications on File Prior to Visit  Medication Sig Dispense Refill   aspirin 81 MG tablet Take 81 mg by mouth daily.     azelastine (ASTELIN) 0.1 % nasal spray Use in each nostril as directed 30 mL 0   cholecalciferol (VITAMIN D) 1000 UNITS tablet Take 1,000 Units by mouth daily.     escitalopram (LEXAPRO) 20 MG tablet TAKE 1 TABLET BY MOUTH EVERY DAY 90 tablet 3   Multiple Vitamins-Minerals (MULTIVITAMIN PO) Take by mouth.     Probiotic Product (ACIDOPHILUS/GOAT MILK) CAPS Take by mouth.     Turmeric (QC TUMERIC COMPLEX) 500 MG CAPS Taking daily     valACYclovir (VALTREX) 500 MG  tablet Take 1 tablet (500 mg total) by mouth as needed. 30 tablet 3   No current facility-administered medications on file prior to visit.    Past Medical History:  Diagnosis Date   Anxiety    Breast cancer ([place])    s/p double mastectomy 01/2010   Depression    Hypertension    Osteoporosis    STD (sexually transmitted disease)    Herpes    Past Surgical History:  Procedure Laterality Date   COLPOSCOPY     cone procedure  2010   unsure about date   double mastectomy     KNEE SURGERY     MASTECTOMY  01/25/2010   Bilateral     Social History   Socioeconomic History   Marital status: Married    Spouse name: Not on file   Number of children: Not on file   Years of education: Not on file   Highest education level: Not on file  Occupational History   Occupation: mortgage lender  Tobacco Use   Smoking status: Former    Years: 15.00    Types: Cigarettes    Quit date: 12/06/2012    Years since quitting: 8.5   Smokeless tobacco: Never   Tobacco comments:    quit over a year ago  Vaping Use   Vaping Use: Never used  Substance and  Sexual Activity   Alcohol use: Yes    Alcohol/week: 5.0 standard drinks    Types: 5 Glasses of wine per week   Drug use: No   Sexual activity: Yes    Partners: Male    Birth control/protection: Post-menopausal  Other Topics Concern   Not on file  Social History Narrative   Not on file   Social Determinants of Health   Financial Resource Strain: Not on file  Food Insecurity: Not on file  Transportation Needs: Not on file  Physical Activity: Not on file  Stress: Not on file  Social Connections: Not on file    Family History  Problem Relation Age of Onset   Ulcers Father    Alcohol abuse Father    Heart failure Mother    Alcohol abuse Mother    Heart disease Mother    Depression Sister    Depression Daughter    Cancer Paternal Grandmother     Review of Systems  Constitutional:  Negative for chills and fever.  Eyes:   Negative for visual disturbance.  Respiratory:  Negative for cough, shortness of breath and wheezing.   Cardiovascular:  Negative for chest pain, palpitations and leg swelling.  Gastrointestinal:  Negative for abdominal pain, blood in stool, constipation, diarrhea and nausea.       No gerd  Genitourinary:  Negative for dysuria.  Musculoskeletal:  Positive for arthralgias (mild) and back pain (mild).  Skin:  Negative for rash.  Neurological:  Negative for light-headedness and headaches.  Psychiatric/Behavioral:  Positive for dysphoric mood. The patient is nervous/anxious.       Objective:   Vitals:   06/29/21 1109  BP: 128/80  Pulse: 72  Temp: 98 F (36.7 C)  SpO2: 97%   Filed Weights   06/29/21 1109  Weight: 142 lb 8 oz (64.6 kg)   Body mass index is 23.35 kg/m.  BP Readings from Last 3 Encounters:  06/29/21 128/80  06/14/20 120/72  03/04/19 134/88    Wt Readings from Last 3 Encounters:  06/29/21 142 lb 8 oz (64.6 kg)  06/14/20 142 lb 12.8 oz (64.8 kg)  03/04/19 160 lb 12.8 oz (72.9 kg)    Depression screen [facility] 2/9 06/29/2021 03/04/2019 01/21/2018  Decreased Interest 2 0 0  Down, Depressed, Hopeless 0 0 0  PHQ - 2 Score 2 0 0  Altered sleeping 0 2 -  Tired, decreased energy 2 1 -  Change in appetite 1 0 -  Feeling bad or failure about yourself  0 0 -  Trouble concentrating 0 0 -  Moving slowly or fidgety/restless 0 0 -  Suicidal thoughts 0 0 -  PHQ-9 Score 5 3 -  Difficult doing work/chores Not difficult at all - -    GAD 7 : Generalized Anxiety Score 06/29/2021 03/04/2019  Nervous, Anxious, on Edge 0 1  Control/stop worrying 0 1  Worry too much - different things 0 1  Trouble relaxing 0 1  Restless 0 0  Easily annoyed or irritable 0 0  Afraid - awful might happen 0 0  Total GAD 7 Score 0 4  Anxiety Difficulty Not difficult at all -       Physical Exam Constitutional: She appears well-developed and well-nourished. No distress.  HENT:  Head:  Normocephalic and atraumatic.  Right Ear: External ear normal. Normal ear canal and TM Left Ear: External ear normal.  Normal ear canal and TM Mouth/Throat: Oropharynx is clear and moist.  Eyes: Conjunctivae and EOM are  normal.  Neck: Neck supple. No tracheal deviation present. No thyromegaly present.  No carotid bruit  Cardiovascular: Normal rate, regular rhythm and normal heart sounds.   No murmur heard.  No edema. Pulmonary/Chest: Effort normal and breath sounds normal. No respiratory distress. She has no wheezes. She has no rales.  Breast: deferred   Abdominal: Soft. She exhibits no distension. There is no tenderness.  Lymphadenopathy: She has no cervical adenopathy.  Skin: Skin is warm and dry. She is not diaphoretic.  Psychiatric: She has a normal mood and affect. Her behavior is normal.     Lab Results  Component Value Date   WBC 8.1 06/14/2020   HGB 13.2 06/14/2020   HCT 39.2 06/14/2020   PLT 257 06/14/2020   GLUCOSE 95 06/14/2020   CHOL 199 06/14/2020   TRIG 78 06/14/2020   HDL 105 06/14/2020   LDLCALC 78 06/14/2020   ALT 16 06/14/2020   AST 15 06/14/2020   NA 143 06/14/2020   K 4.5 06/14/2020   CL 106 06/14/2020   CREATININE 0.72 06/14/2020   BUN 14 06/14/2020   CO2 30 06/14/2020   TSH 3.12 06/14/2020   HGBA1C 5.1 06/14/2020         Assessment & Plan:   Physical exam: Screening blood work  ordered Exercise  regular Weight  normal Substance abuse  none   Reviewed recommended immunizations.   Health Maintenance  Topic Date Due   DEXA SCAN  07/29/2020   INFLUENZA VACCINE  04/25/2021   COLONOSCOPY (Pts 45-66yrs Insurance coverage will need to be confirmed)  06/15/2021   COVID-19 Vaccine (4 - Booster for [place] series) 07/15/2021 (Originally 09/30/2020)   TETANUS/TDAP  06/29/2022 (Originally 12/04/1969)   Hepatitis C Screening  Completed   Zoster Vaccines- Shingrix  Completed   HPV VACCINES  Aged Out          See Problem List for Assessment  and Plan of chronic medical problems.

## 2021-06-28 NOTE — Patient Instructions (Addendum)
Flu immunization administered today.     Blood work was ordered.     I will order a CT scan to look at your coronary arteries.  They will call you to schedule this.     Medications changes include :   Wellbutrin 150 mg daily  Your prescription(s) have been submitted to your pharmacy. Please take as directed and contact our office if you believe you are having problem(s) with the medication(s).    Please followup in 1 year    Health Maintenance, Female Adopting a healthy lifestyle and getting preventive care are important in promoting health and wellness. Ask your health care provider about: The right schedule for you to have regular tests and exams. Things you can do on your own to prevent diseases and keep yourself healthy. What should I know about diet, weight, and exercise? Eat a healthy diet  Eat a diet that includes plenty of vegetables, fruits, low-fat dairy products, and lean protein. Do not eat a lot of foods that are high in solid fats, added sugars, or sodium. Maintain a healthy weight Body mass index (BMI) is used to identify weight problems. It estimates body fat based on height and weight. Your health care provider can help determine your BMI and help you achieve or maintain a healthy weight. Get regular exercise Get regular exercise. This is one of the most important things you can do for your health. Most adults should: Exercise for at least 150 minutes each week. The exercise should increase your heart rate and make you sweat (moderate-intensity exercise). Do strengthening exercises at least twice a week. This is in addition to the moderate-intensity exercise. Spend less time sitting. Even light physical activity can be beneficial. Watch cholesterol and blood lipids Have your blood tested for lipids and cholesterol at 68 years of age, then have this test every 5 years. Have your cholesterol levels checked more often if: Your lipid or cholesterol levels are  high. You are older than 68 years of age. You are at high risk for heart disease. What should I know about cancer screening? Depending on your health history and family history, you may need to have cancer screening at various ages. This may include screening for: Breast cancer. Cervical cancer. Colorectal cancer. Skin cancer. Lung cancer. What should I know about heart disease, diabetes, and high blood pressure? Blood pressure and heart disease High blood pressure causes heart disease and increases the risk of stroke. This is more likely to develop in people who have high blood pressure readings, are of African descent, or are overweight. Have your blood pressure checked: Every 3-5 years if you are 71-70 years of age. Every year if you are 40 years old or older. Diabetes Have regular diabetes screenings. This checks your fasting blood sugar level. Have the screening done: Once every three years after age 78 if you are at a normal weight and have a low risk for diabetes. More often and at a younger age if you are overweight or have a high risk for diabetes. What should I know about preventing infection? Hepatitis B If you have a higher risk for hepatitis B, you should be screened for this virus. Talk with your health care provider to find out if you are at risk for hepatitis B infection. Hepatitis C Testing is recommended for: Everyone born from [birth year] through 1965. Anyone with known risk factors for hepatitis C. Sexually transmitted infections (STIs) Get screened for STIs, including gonorrhea and chlamydia, if: You  are sexually active and are younger than 68 years of age. You are older than 68 years of age and your health care provider tells you that you are at risk for this type of infection. Your sexual activity has changed since you were last screened, and you are at increased risk for chlamydia or gonorrhea. Ask your health care provider if you are at risk. Ask your health care  provider about whether you are at high risk for HIV. Your health care provider may recommend a prescription medicine to help prevent HIV infection. If you choose to take medicine to prevent HIV, you should first get tested for HIV. You should then be tested every 3 months for as long as you are taking the medicine. Pregnancy If you are about to stop having your period (premenopausal) and you may become pregnant, seek counseling before you get pregnant. Take 400 to 800 micrograms (mcg) of folic acid every day if you become pregnant. Ask for birth control (contraception) if you want to prevent pregnancy. Osteoporosis and menopause Osteoporosis is a disease in which the bones lose minerals and strength with aging. This can result in bone fractures. If you are 72 years old or older, or if you are at risk for osteoporosis and fractures, ask your health care provider if you should: Be screened for bone loss. Take a calcium or vitamin D supplement to lower your risk of fractures. Be given hormone replacement therapy (HRT) to treat symptoms of menopause. Follow these instructions at home: Lifestyle Do not use any products that contain nicotine or tobacco, such as cigarettes, e-cigarettes, and chewing tobacco. If you need help quitting, ask your health care provider. Do not use street drugs. Do not share needles. Ask your health care provider for help if you need support or information about quitting drugs. Alcohol use Do not drink alcohol if: Your health care provider tells you not to drink. You are pregnant, may be pregnant, or are planning to become pregnant. If you drink alcohol: Limit how much you use to 0-1 drink a day. Limit intake if you are breastfeeding. Be aware of how much alcohol is in your drink. In the [place] one drink equals one 12 oz bottle of beer (355 mL), one 5 oz glass of wine (148 mL), or one 1 oz glass of hard liquor (44 mL). General instructions Schedule regular health,  dental, and eye exams. Stay current with your vaccines. Tell your health care provider if: You often feel depressed. You have ever been abused or do not feel safe at home. Summary Adopting a healthy lifestyle and getting preventive care are important in promoting health and wellness. Follow your health care provider's instructions about healthy diet, exercising, and getting tested or screened for diseases. Follow your health care provider's instructions on monitoring your cholesterol and blood pressure. This information is not intended to replace advice given to you by your health care provider. Make sure you discuss any questions you have with your health care provider. Document Revised: 11/19/2020 Document Reviewed: 09/04/2018 Elsevier Patient Education  2022 [employer].

## 2021-06-29 ENCOUNTER — Ambulatory Visit (INDEPENDENT_AMBULATORY_CARE_PROVIDER_SITE_OTHER): Payer: Medicare HMO | Admitting: Internal Medicine

## 2021-06-29 ENCOUNTER — Other Ambulatory Visit: Payer: Self-pay

## 2021-06-29 ENCOUNTER — Encounter: Payer: Self-pay | Admitting: Internal Medicine

## 2021-06-29 VITALS — BP 128/80 | HR 72 | Temp 98.0°F | Ht 65.5 in | Wt 142.5 lb

## 2021-06-29 DIAGNOSIS — Z23 Encounter for immunization: Secondary | ICD-10-CM

## 2021-06-29 DIAGNOSIS — Z136 Encounter for screening for cardiovascular disorders: Secondary | ICD-10-CM | POA: Insufficient documentation

## 2021-06-29 DIAGNOSIS — F419 Anxiety disorder, unspecified: Secondary | ICD-10-CM | POA: Diagnosis not present

## 2021-06-29 DIAGNOSIS — R195 Other fecal abnormalities: Secondary | ICD-10-CM | POA: Diagnosis not present

## 2021-06-29 DIAGNOSIS — R159 Full incontinence of feces: Secondary | ICD-10-CM

## 2021-06-29 DIAGNOSIS — R69 Illness, unspecified: Secondary | ICD-10-CM | POA: Diagnosis not present

## 2021-06-29 DIAGNOSIS — F3289 Other specified depressive episodes: Secondary | ICD-10-CM

## 2021-06-29 DIAGNOSIS — Z853 Personal history of malignant neoplasm of breast: Secondary | ICD-10-CM

## 2021-06-29 DIAGNOSIS — M85851 Other specified disorders of bone density and structure, right thigh: Secondary | ICD-10-CM | POA: Diagnosis not present

## 2021-06-29 DIAGNOSIS — M810 Age-related osteoporosis without current pathological fracture: Secondary | ICD-10-CM

## 2021-06-29 DIAGNOSIS — J309 Allergic rhinitis, unspecified: Secondary | ICD-10-CM | POA: Diagnosis not present

## 2021-06-29 DIAGNOSIS — Z0000 Encounter for general adult medical examination without abnormal findings: Secondary | ICD-10-CM

## 2021-06-29 DIAGNOSIS — Z780 Asymptomatic menopausal state: Secondary | ICD-10-CM | POA: Diagnosis not present

## 2021-06-29 DIAGNOSIS — R7303 Prediabetes: Secondary | ICD-10-CM | POA: Diagnosis not present

## 2021-06-29 DIAGNOSIS — Z1331 Encounter for screening for depression: Secondary | ICD-10-CM

## 2021-06-29 DIAGNOSIS — M85852 Other specified disorders of bone density and structure, left thigh: Secondary | ICD-10-CM | POA: Diagnosis not present

## 2021-06-29 LAB — CBC WITH DIFFERENTIAL/PLATELET
Basophils Absolute: 0 10*3/uL (ref 0.0–0.1)
Basophils Relative: 0.7 % (ref 0.0–3.0)
Eosinophils Absolute: 0.1 10*3/uL (ref 0.0–0.7)
Eosinophils Relative: 1.2 % (ref 0.0–5.0)
HCT: 42.1 % (ref 36.0–46.0)
Hemoglobin: 13.8 g/dL (ref 12.0–15.0)
Lymphocytes Relative: 32.2 % (ref 12.0–46.0)
Lymphs Abs: 2.3 10*3/uL (ref 0.7–4.0)
MCHC: 32.6 g/dL (ref 30.0–36.0)
MCV: 94.1 fl (ref 78.0–100.0)
Monocytes Absolute: 0.7 10*3/uL (ref 0.1–1.0)
Monocytes Relative: 9.5 % (ref 3.0–12.0)
Neutro Abs: 3.9 10*3/uL (ref 1.4–7.7)
Neutrophils Relative %: 56.4 % (ref 43.0–77.0)
Platelets: 279 10*3/uL (ref 150.0–400.0)
RBC: 4.48 Mil/uL (ref 3.87–5.11)
RDW: 12.7 % (ref 11.5–15.5)
WBC: 7 10*3/uL (ref 4.0–10.5)

## 2021-06-29 LAB — COMPREHENSIVE METABOLIC PANEL
ALT: 14 U/L (ref 0–35)
AST: 16 U/L (ref 0–37)
Albumin: 4.5 g/dL (ref 3.5–5.2)
Alkaline Phosphatase: 45 U/L (ref 39–117)
BUN: 15 mg/dL (ref 6–23)
CO2: 30 mEq/L (ref 19–32)
Calcium: 9.5 mg/dL (ref 8.4–10.5)
Chloride: 102 mEq/L (ref 96–112)
Creatinine, Ser: 0.73 mg/dL (ref 0.40–1.20)
GFR: 84.41 mL/min (ref >60.00)
Glucose, Bld: 97 mg/dL (ref 70–99)
Potassium: 4 mEq/L (ref 3.5–5.1)
Sodium: 140 mEq/L (ref 135–145)
Total Bilirubin: 0.5 mg/dL (ref 0.2–1.2)
Total Protein: 6.8 g/dL (ref 6.0–8.3)

## 2021-06-29 LAB — TSH: TSH: 3.48 u[IU]/mL (ref 0.35–5.50)

## 2021-06-29 LAB — LIPID PANEL
Cholesterol: 203 mg/dL — ABNORMAL HIGH (ref 0–200)
HDL: 93.4 mg/dL (ref >39.00)
LDL Cholesterol: 100 mg/dL — ABNORMAL HIGH (ref 0–99)
NonHDL: 109.54
Total CHOL/HDL Ratio: 2
Triglycerides: 49 mg/dL (ref 0.0–149.0)
VLDL: 9.8 mg/dL (ref 0.0–40.0)

## 2021-06-29 LAB — HM DEXA SCAN

## 2021-06-29 LAB — HEMOGLOBIN A1C: Hgb A1c MFr Bld: 5.9 % (ref 4.6–6.5)

## 2021-06-29 MED ORDER — ALPRAZOLAM 0.25 MG PO TABS: ORAL_TABLET | ORAL | 0 refills | Status: DC

## 2021-06-29 MED ORDER — LEVOCETIRIZINE DIHYDROCHLORIDE 5 MG PO TABS: 5.0000 mg | ORAL_TABLET | Freq: Every evening | ORAL | 2 refills | Status: DC

## 2021-06-29 MED ORDER — BUPROPION HCL ER (XL) 150 MG PO TB24: 150.0000 mg | ORAL_TABLET | Freq: Every day | ORAL | 3 refills | Status: DC

## 2021-06-29 MED ORDER — MONTELUKAST SODIUM 10 MG PO TABS: 10.0000 mg | ORAL_TABLET | Freq: Every evening | ORAL | 1 refills | Status: DC

## 2021-06-29 NOTE — Assessment & Plan Note (Addendum)
Chronic Controlled, Stable - GAD7 score is 0 Continue Lexapro 10 mg daily, alprazolam 0.25 mg daily as needed

## 2021-06-29 NOTE — Assessment & Plan Note (Signed)
Chronic Continue xyzal 5 mg and singulair nightly as needed

## 2021-06-29 NOTE — Assessment & Plan Note (Signed)
She is due for colonoscopy and will call Dr. Collene Mares to schedule this She continues to have what started a little over a year ago of occasional fecal incontinence with loose stools ?  Food triggering this She is taking a probiotic daily-we will continue Discussed that she may need to try supplemental fiber Will discuss with GI

## 2021-06-29 NOTE — Assessment & Plan Note (Signed)
Chronic Check a1c Low sugar / carb diet Stressed regular exercise  

## 2021-06-29 NOTE — Assessment & Plan Note (Addendum)
Chronic Having dexa today She is exercising Taking vitamin d daily

## 2021-06-29 NOTE — Assessment & Plan Note (Addendum)
Chronic PHQ 9 score 5 - not ideally controlled Continue Lexapro 10 mg daily Start Wellbutrin xl 150 mg daily

## 2021-06-29 NOTE — Assessment & Plan Note (Signed)
Has had bilateral mastectomies Sees GYN regularly No evidence of recurrence

## 2021-06-29 NOTE — Assessment & Plan Note (Addendum)
Mom had CAD and had MVR and CABG in her 52's Discussed options of seeing a cardiologist versus imaging Discussed controlling risk factors She is increasing her exercise Blood pressure well controlled Cholesterol acceptable-we will recheck today Will get CT coronary artery scan

## 2021-06-30 ENCOUNTER — Encounter: Payer: Self-pay | Admitting: Internal Medicine

## 2021-07-10 ENCOUNTER — Encounter: Payer: Self-pay | Admitting: Internal Medicine

## 2021-07-14 ENCOUNTER — Encounter: Payer: Self-pay | Admitting: Internal Medicine

## 2021-07-14 NOTE — Progress Notes (Signed)
Outside notes received. Information abstracted. Notes sent to scan.  

## 2021-07-15 MED ORDER — ALENDRONATE SODIUM 70 MG PO TABS: 70.0000 mg | ORAL_TABLET | ORAL | 3 refills | Status: DC

## 2021-07-15 NOTE — Addendum Note (Signed)
Addended by: Binnie Rail on: 07/15/2021 07:20 AM   Modules accepted: Orders

## 2021-07-20 ENCOUNTER — Encounter: Payer: Self-pay | Admitting: Internal Medicine

## 2021-07-20 NOTE — Progress Notes (Signed)
Outside notes received. Information abstracted. Notes sent to scan.  

## 2021-08-31 ENCOUNTER — Encounter: Payer: Self-pay | Admitting: Internal Medicine

## 2021-09-01 NOTE — Addendum Note (Signed)
Addended by: Marcina Millard on: 09/01/2021 10:33 AM   Modules accepted: Orders

## 2021-10-14 DIAGNOSIS — H1031 Unspecified acute conjunctivitis, right eye: Secondary | ICD-10-CM | POA: Diagnosis not present

## 2021-10-25 DIAGNOSIS — H1045 Other chronic allergic conjunctivitis: Secondary | ICD-10-CM | POA: Diagnosis not present

## 2021-11-07 ENCOUNTER — Encounter: Payer: Self-pay | Admitting: Internal Medicine

## 2021-11-07 DIAGNOSIS — Z136 Encounter for screening for cardiovascular disorders: Secondary | ICD-10-CM

## 2021-11-15 DIAGNOSIS — I2510 Atherosclerotic heart disease of native coronary artery without angina pectoris: Secondary | ICD-10-CM | POA: Diagnosis not present

## 2021-11-15 DIAGNOSIS — Z136 Encounter for screening for cardiovascular disorders: Secondary | ICD-10-CM | POA: Diagnosis not present

## 2021-11-17 DIAGNOSIS — C44719 Basal cell carcinoma of skin of left lower limb, including hip: Secondary | ICD-10-CM | POA: Diagnosis not present

## 2021-11-17 DIAGNOSIS — D485 Neoplasm of uncertain behavior of skin: Secondary | ICD-10-CM | POA: Diagnosis not present

## 2021-11-17 DIAGNOSIS — D225 Melanocytic nevi of trunk: Secondary | ICD-10-CM | POA: Diagnosis not present

## 2021-11-17 DIAGNOSIS — Z7189 Other specified counseling: Secondary | ICD-10-CM | POA: Diagnosis not present

## 2021-11-17 DIAGNOSIS — L821 Other seborrheic keratosis: Secondary | ICD-10-CM | POA: Diagnosis not present

## 2021-11-17 DIAGNOSIS — L738 Other specified follicular disorders: Secondary | ICD-10-CM | POA: Diagnosis not present

## 2021-11-17 DIAGNOSIS — L814 Other melanin hyperpigmentation: Secondary | ICD-10-CM | POA: Diagnosis not present

## 2021-11-20 ENCOUNTER — Encounter: Payer: Self-pay | Admitting: Internal Medicine

## 2021-11-20 DIAGNOSIS — R931 Abnormal findings on diagnostic imaging of heart and coronary circulation: Secondary | ICD-10-CM | POA: Insufficient documentation

## 2021-12-21 DIAGNOSIS — C44719 Basal cell carcinoma of skin of left lower limb, including hip: Secondary | ICD-10-CM | POA: Diagnosis not present

## 2022-01-23 ENCOUNTER — Encounter: Payer: Self-pay | Admitting: Internal Medicine

## 2022-03-13 ENCOUNTER — Encounter: Payer: Self-pay | Admitting: Internal Medicine

## 2022-03-15 ENCOUNTER — Ambulatory Visit (HOSPITAL_BASED_OUTPATIENT_CLINIC_OR_DEPARTMENT_OTHER): Payer: Medicare HMO | Admitting: Obstetrics & Gynecology

## 2022-03-17 ENCOUNTER — Other Ambulatory Visit: Payer: Self-pay | Admitting: Internal Medicine

## 2022-03-23 ENCOUNTER — Ambulatory Visit: Payer: Medicare HMO

## 2022-03-23 ENCOUNTER — Telehealth: Payer: Self-pay

## 2022-03-23 NOTE — Telephone Encounter (Signed)
Please reschedule patient if she call back to the office for her AWV-I.

## 2022-03-23 NOTE — Telephone Encounter (Signed)
Patient scheduled for AWV-I for 2:00 pm.  Left voice message to call [phone] or [phone].

## 2022-04-26 DIAGNOSIS — Z08 Encounter for follow-up examination after completed treatment for malignant neoplasm: Secondary | ICD-10-CM | POA: Diagnosis not present

## 2022-04-26 DIAGNOSIS — L708 Other acne: Secondary | ICD-10-CM | POA: Diagnosis not present

## 2022-04-26 DIAGNOSIS — L814 Other melanin hyperpigmentation: Secondary | ICD-10-CM | POA: Diagnosis not present

## 2022-04-26 DIAGNOSIS — Z85828 Personal history of other malignant neoplasm of skin: Secondary | ICD-10-CM | POA: Diagnosis not present

## 2022-04-26 DIAGNOSIS — L821 Other seborrheic keratosis: Secondary | ICD-10-CM | POA: Diagnosis not present

## 2022-04-26 DIAGNOSIS — D225 Melanocytic nevi of trunk: Secondary | ICD-10-CM | POA: Diagnosis not present

## 2022-05-05 ENCOUNTER — Encounter: Payer: Self-pay | Admitting: Internal Medicine

## 2022-05-10 DIAGNOSIS — S0006XA Insect bite (nonvenomous) of scalp, initial encounter: Secondary | ICD-10-CM | POA: Diagnosis not present

## 2022-05-10 DIAGNOSIS — W57XXXA Bitten or stung by nonvenomous insect and other nonvenomous arthropods, initial encounter: Secondary | ICD-10-CM | POA: Diagnosis not present

## 2022-05-11 ENCOUNTER — Other Ambulatory Visit: Payer: Self-pay | Admitting: Internal Medicine

## 2022-05-11 ENCOUNTER — Ambulatory Visit (INDEPENDENT_AMBULATORY_CARE_PROVIDER_SITE_OTHER): Payer: Medicare HMO

## 2022-05-11 DIAGNOSIS — Z0000 Encounter for general adult medical examination without abnormal findings: Secondary | ICD-10-CM | POA: Diagnosis not present

## 2022-05-11 NOTE — Progress Notes (Signed)
I connected with Angelica Patton today by telephone and verified that I am speaking with the correct person using two identifiers. Location patient: home Location provider: work Persons participating in the virtual visit: patient, provider.   I discussed the limitations, risks, security and privacy concerns of performing an evaluation and management service by telephone and the availability of in person appointments. I also discussed with the patient that there may be a patient responsible charge related to this service. The patient expressed understanding and verbally consented to this telephonic visit.    Interactive audio and video telecommunications were attempted between this provider and patient, however failed, due to patient having technical difficulties OR patient did not have access to video capability.  We continued and completed visit with audio only.  Some vital signs may be absent or patient reported.   Time Spent with patient on telephone encounter: 30 minutes  Subjective:   Angelica Patton is a 71 y.o. female who presents for an Initial Medicare Annual Wellness Visit.  Review of Systems     Cardiac Risk Factors include: advanced age (>40mn, >>70women);family history of premature cardiovascular disease     Objective:    There were no vitals filed for this visit. There is no height or weight on file to calculate BMI.     05/11/2022    9:19 AM  Advanced Directives  Does Patient Have a Medical Advance Directive? Yes  Type of Advance Directive Living will;Healthcare Power of Attorney  Does patient want to make changes to medical advance directive? No - Patient declined  Copy of [place]? No - copy requested    Current Medications (verified) Outpatient Encounter Medications as of 05/11/2022  Medication Sig   alendronate (FOSAMAX) 70 MG tablet Take 1 tablet (70 mg total) by mouth every 7 (seven) days. Take with a full glass of water on an  empty stomach.   ALPRAZolam (XANAX) 0.25 MG tablet TAKE 1 TABLET BY MOUTH AT BEDTIME AS NEEDED FOR ANXIETY   aspirin 81 MG tablet Take 81 mg by mouth daily.   azelastine (ASTELIN) 0.1 % nasal spray Use in each nostril as directed   buPROPion (WELLBUTRIN XL) 150 MG 24 hr tablet Take 1 tablet (150 mg total) by mouth daily.   cholecalciferol (VITAMIN D) 1000 UNITS tablet Take 1,000 Units by mouth daily.   escitalopram (LEXAPRO) 20 MG tablet TAKE 1 TABLET BY MOUTH EVERY DAY   levocetirizine (XYZAL) 5 MG tablet Take 1 tablet (5 mg total) by mouth every evening.   montelukast (SINGULAIR) 10 MG tablet Take 1 tablet (10 mg total) by mouth every evening.   Multiple Vitamins-Minerals (MULTIVITAMIN PO) Take by mouth.   Probiotic Product (ACIDOPHILUS/GOAT MILK) CAPS Take by mouth.   Turmeric (QC TUMERIC COMPLEX) 500 MG CAPS Taking daily   valACYclovir (VALTREX) 500 MG tablet Take 1 tablet (500 mg total) by mouth as needed.   No facility-administered encounter medications on file as of 05/11/2022.    Allergies (verified) Patient has no known allergies.   History: Past Medical History:  Diagnosis Date   Anxiety    Breast cancer ([place]    s/p double mastectomy 01/2010   Depression    Hypertension    Osteoporosis    STD (sexually transmitted disease)    Herpes   Past Surgical History:  Procedure Laterality Date   COLPOSCOPY     cone procedure  2010   unsure about date   double mastectomy  KNEE SURGERY     MASTECTOMY  01/25/2010   Bilateral    Family History  Problem Relation Age of Onset   Ulcers Father    Alcohol abuse Father    Heart failure Mother    Alcohol abuse Mother    Heart disease Mother    Depression Sister    Depression Daughter    Cancer Paternal Grandmother    Social History   Socioeconomic History   Marital status: Married    Spouse name: Not on file   Number of children: Not on file   Years of education: Not on file   Highest education level: Not on file   Occupational History   Occupation: mortgage lender  Tobacco Use   Smoking status: Former    Years: 15.00    Types: Cigarettes    Quit date: 12/06/2012    Years since quitting: 9.4   Smokeless tobacco: Never   Tobacco comments:    quit over a year ago  Vaping Use   Vaping Use: Never used  Substance and Sexual Activity   Alcohol use: Yes    Alcohol/week: 5.0 standard drinks of alcohol    Types: 5 Glasses of wine per week   Drug use: No   Sexual activity: Yes    Partners: Male    Birth control/protection: Post-menopausal  Other Topics Concern   Not on file  Social History Narrative   Not on file   Social Determinants of Health   Financial Resource Strain: Low Risk  (05/11/2022)   Overall Financial Resource Strain (CARDIA)    Difficulty of Paying Living Expenses: Not hard at all  Food Insecurity: No Food Insecurity (05/11/2022)   Hunger Vital Sign    Worried About Running Out of Food in the Last Year: Never true    Ran Out of Food in the Last Year: Never true  Transportation Needs: No Transportation Needs (05/11/2022)   PRAPARE - Hydrologist (Medical): No    Lack of Transportation (Non-Medical): No  Physical Activity: Sufficiently Active (05/11/2022)   Exercise Vital Sign    Days of Exercise per Week: 5 days    Minutes of Exercise per Session: 30 min  Stress: No Stress Concern Present (05/11/2022)   [place]    Feeling of Stress : Not at all  Social Connections: [place] (05/11/2022)   Social Connection and Isolation Panel [NHANES]    Frequency of Communication with Friends and Family: More than three times a week    Frequency of Social Gatherings with Friends and Family: More than three times a week    Attends Religious Services: More than 4 times per year    Active Member of [employer] or Organizations: Yes    Attends Music therapist: More than 4 times per  year    Marital Status: Married    Tobacco Counseling Counseling given: Not Answered Tobacco comments: quit over a year ago   Clinical Intake:  Pre-visit preparation completed: Yes  Pain : No/denies pain     Nutritional Risks: None Diabetes: No  How often do you need to have someone help you when you read instructions, pamphlets, or other written materials from your doctor or pharmacy?: 1 - Never What is the last grade level you completed in school?: HSG; Associate's Degree  Diabetic? no  Interpreter Needed?: No  Information entered by :: Lisette Abu, LPN.   Activities of Daily Living  05/11/2022    9:30 AM 06/29/2021   11:11 AM  In your present state of health, do you have any difficulty performing the following activities:  Hearing? 0 0  Vision? 0 0  Difficulty concentrating or making decisions? 0 0  Walking or climbing stairs? 0 0  Dressing or bathing? 0 0  Doing errands, shopping? 0 0  Preparing Food and eating ? N   Using the Toilet? N   In the past six months, have you accidently leaked urine? N   Do you have problems with loss of bowel control? Y   Comment due to IBS   Managing your Medications? N   Managing your Finances? N   Housekeeping or managing your Housekeeping? N     Patient Care Team: Binnie Rail, MD as PCP - General (Internal Medicine)  Indicate any recent Medical Services you may have received from other than [facility] providers in the past year (date may be approximate).     Assessment:   This is a routine wellness examination for Angelica Patton.  Hearing/Vision screen Hearing Screening - Comments:: Patient denied any hearing difficulty.   No hearing aids.   Vision Screening - Comments:: Patient does wear corrective lenses/contacts.  Eye exam done by: Jeanie Cooks, OD.    Dietary issues and exercise activities discussed: Current Exercise Habits: Home exercise routine, Type of exercise: walking, Time (Minutes): 30, Frequency  (Times/Week): 5, Weekly Exercise (Minutes/Week): 150, Intensity: Moderate, Exercise limited by: None identified   Goals Addressed             This Visit's Progress    To stay healthy.        Depression Screen    05/11/2022    9:22 AM 06/29/2021   12:09 PM 03/04/2019    4:14 PM 01/21/2018    2:47 PM  PHQ 2/9 Scores  PHQ - 2 Score 0 2 0 0  PHQ- 9 Score  5 3     Fall Risk    05/11/2022    9:30 AM 06/29/2021   11:10 AM 03/04/2019    9:23 AM 01/21/2018    2:47 PM  [place] in the past year? 0 0 0 No  Number falls in past yr: 0 0 0   Injury with Fall? 0 0    Risk for fall due to : No Fall Risks No Fall Risks    Follow up Falls evaluation completed Falls evaluation completed      [place]:  Any stairs in or around the home? Yes  If so, are there any without handrails? No  Home free of loose throw rugs in walkways, pet beds, electrical cords, etc? Yes  Adequate lighting in your home to reduce risk of falls? Yes   ASSISTIVE DEVICES UTILIZED TO PREVENT FALLS:  Life alert? No  Use of a cane, walker or w/c? No  Grab bars in the bathroom? Yes  Shower chair or bench in shower? Yes  Elevated toilet seat or a handicapped toilet? yes  TIMED UP AND GO:  Was the test performed? No .  Length of time to ambulate 10 feet: n/a sec.   Appearance of gait: Gait not evaluated during this visit.  Cognitive Function:        05/11/2022    9:31 AM  6CIT Screen  What Year? 0 points  What month? 0 points  What time? 0 points  Count back from 20 0 points  Months  in reverse 0 points  Repeat phrase 0 points  Total Score 0 points    Immunizations Immunization History  Administered Date(s) Administered   Fluad Quad(high Dose 65+) 06/14/2020, 06/29/2021   Influenza Inj Mdck Quad Pf 06/23/2019   Influenza Split 07/17/2011, 07/15/2012   Influenza, High Dose Seasonal PF 05/30/2018   Influenza,inj,Quad PF,6+ Mos 07/03/2016, 09/07/2017    Moderna Covid-19 Vaccine Bivalent Booster 56yr & up 07/13/2021   PFIZER(Purple Top)SARS-COV-2 Vaccination 11/24/2019, 12/16/2019, 07/08/2020   Pneumococcal Conjugate-13 01/21/2018   Pneumococcal Polysaccharide-23 07/17/2011, 03/04/2019   Zoster Recombinat (Shingrix) 11/13/2017, 01/16/2018    TDAP status: Due, Education has been provided regarding the importance of this vaccine. Advised may receive this vaccine at local pharmacy or Health Dept. Aware to provide a copy of the vaccination record if obtained from local pharmacy or Health Dept. Verbalized acceptance and understanding.  Flu Vaccine status: Up to date  Pneumococcal vaccine status: Up to date  Covid-19 vaccine status: Completed vaccines  Qualifies for Shingles Vaccine? Yes   Zostavax completed Yes   Shingrix Completed?: Yes  Screening Tests Health Maintenance  Topic Date Due   COVID-19 Vaccine (5 - Pfizer risk series) 09/07/2021   INFLUENZA VACCINE  04/25/2022   TETANUS/TDAP  06/29/2022 (Originally 12/04/1969)   DEXA SCAN  06/30/2023   COLONOSCOPY (Pts 45-461yrInsurance coverage will need to be confirmed)  06/15/2024   Pneumonia Vaccine 69Years old  Completed   Hepatitis C Screening  Completed   Zoster Vaccines- Shingrix  Completed   HPV VACCINES  Aged Out    Health Maintenance  Health Maintenance Due  Topic Date Due   COVID-19 Vaccine (5 - Pfizer risk series) 09/07/2021   INFLUENZA VACCINE  04/25/2022    Colorectal cancer screening: Type of screening: Colonoscopy. Completed 06/09/2014. Repeat every 10 years  Mammogram status: No longer required due to history of breast cancer.  Bone Density status: Completed 06/29/2021. Results reflect: Bone density results: OSTEOPOROSIS. Repeat every 2 years.  Lung Cancer Screening: (Low Dose CT Chest recommended if Age 69-80ears, 30 pack-year currently smoking OR have quit w/in 15years.) does not qualify.   Lung Cancer Screening Referral: no  Additional  Screening:  Hepatitis C Screening: does qualify; Completed 08/31/2016  Vision Screening: Recommended annual ophthalmology exams for early detection of glaucoma and other disorders of the eye. Is the patient up to date with their annual eye exam?  Yes  Who is the provider or what is the name of the office in which the patient attends annual eye exams? [facility]. If pt is not established with a provider, would they like to be referred to a provider to establish care? No .   Dental Screening: Recommended annual dental exams for proper oral hygiene  Community Resource Referral / Chronic Care Management: CRR required this visit?  No   CCM required this visit?  No      Plan:     I have personally reviewed and noted the following in the patient's chart:   Medical and social history Use of alcohol, tobacco or illicit drugs  Current medications and supplements including opioid prescriptions. Patient is not currently taking opioid prescriptions. Functional ability and status Nutritional status Physical activity Advanced directives List of other physicians Hospitalizations, surgeries, and ER visits in previous 12 months Vitals Screenings to include cognitive, depression, and falls Referrals and appointments  In addition, I have reviewed and discussed with patient certain preventive protocols, quality metrics, and best practice recommendations. A written personalized care  plan for preventive services as well as general preventive health recommendations were provided to patient.     Sheral Flow, LPN   04/02/6437   Nurse Notes:  Patient is cogitatively intact. There were no vitals filed for this visit. There is no height or weight on file to calculate BMI. Patient stated that she has no issues with gait or balance; does not use any assistive devices. Medications reviewed with patient; no opioid use noted.

## 2022-05-11 NOTE — Patient Instructions (Signed)
Angelica Patton , Thank you for taking time to come for your Medicare Wellness Visit. I appreciate your ongoing commitment to your health goals. Please review the following plan we discussed and let me know if I can assist you in the future.   Screening recommendations/referrals: Colonoscopy: 06/15/2014; due every 10 years Mammogram: discontinued Bone Density: 06/29/2021; due every 2 years Recommended yearly ophthalmology/optometry visit for glaucoma screening and checkup Recommended yearly dental visit for hygiene and checkup  Vaccinations: Influenza vaccine: 06/29/2021 Pneumococcal vaccine: 01/21/2018, 03/04/2019 Tdap vaccine: due Shingles vaccine: 11/13/2017, 4/24/21019   Covid-19: 11/24/2019, 12/16/2019, 07/08/2020, 07/13/2021  Advanced directives: Yes; Please bring a copy of your health care power of attorney and living will to the office at your convenience.  Conditions/risks identified: Yes  Next appointment: Please schedule your next Medicare Wellness Visit with your Nurse Health Advisor in 1 year by calling [phone].   Preventive Care 79 Years and Older, Female Preventive care refers to lifestyle choices and visits with your health care provider that can promote health and wellness. What does preventive care include? A yearly physical exam. This is also called an annual well check. Dental exams once or twice a year. Routine eye exams. Ask your health care provider how often you should have your eyes checked. Personal lifestyle choices, including: Daily care of your teeth and gums. Regular physical activity. Eating a healthy diet. Avoiding tobacco and drug use. Limiting alcohol use. Practicing safe sex. Taking low-dose aspirin every day. Taking vitamin and mineral supplements as recommended by your health care provider. What happens during an annual well check? The services and screenings done by your health care provider during your annual well check will depend on your age,  overall health, lifestyle risk factors, and family history of disease. Counseling  Your health care provider may ask you questions about your: Alcohol use. Tobacco use. Drug use. Emotional well-being. Home and relationship well-being. Sexual activity. Eating habits. History of falls. Memory and ability to understand (cognition). Work and work Statistician. Reproductive health. Screening  You may have the following tests or measurements: Height, weight, and BMI. Blood pressure. Lipid and cholesterol levels. These may be checked every 5 years, or more frequently if you are over 72 years old. Skin check. Lung cancer screening. You may have this screening every year starting at age 62 if you have a 30-pack-year history of smoking and currently smoke or have quit within the past 15 years. Fecal occult blood test (FOBT) of the stool. You may have this test every year starting at age 73. Flexible sigmoidoscopy or colonoscopy. You may have a sigmoidoscopy every 5 years or a colonoscopy every 10 years starting at age 51. Hepatitis C blood test. Hepatitis B blood test. Sexually transmitted disease (STD) testing. Diabetes screening. This is done by checking your blood sugar (glucose) after you have not eaten for a while (fasting). You may have this done every 1-3 years. Bone density scan. This is done to screen for osteoporosis. You may have this done starting at age 54. Mammogram. This may be done every 1-2 years. Talk to your health care provider about how often you should have regular mammograms. Talk with your health care provider about your test results, treatment options, and if necessary, the need for more tests. Vaccines  Your health care provider may recommend certain vaccines, such as: Influenza vaccine. This is recommended every year. Tetanus, diphtheria, and acellular pertussis (Tdap, Td) vaccine. You may need a Td booster every 10 years. Zoster vaccine. You  may need this after age  76. Pneumococcal 13-valent conjugate (PCV13) vaccine. One dose is recommended after age 63. Pneumococcal polysaccharide (PPSV23) vaccine. One dose is recommended after age 2. Talk to your health care provider about which screenings and vaccines you need and how often you need them. This information is not intended to replace advice given to you by your health care provider. Make sure you discuss any questions you have with your health care provider. Document Released: 10/08/2015 Document Revised: 05/31/2016 Document Reviewed: 07/13/2015 Elsevier Interactive Patient Education  2017 [place] Prevention in the Home Falls can cause injuries. They can happen to people of all ages. There are many things you can do to make your home safe and to help prevent falls. What can I do on the outside of my home? Regularly fix the edges of walkways and driveways and fix any cracks. Remove anything that might make you trip as you walk through a door, such as a raised step or threshold. Trim any bushes or trees on the path to your home. Use bright outdoor lighting. Clear any walking paths of anything that might make someone trip, such as rocks or tools. Regularly check to see if handrails are loose or broken. Make sure that both sides of any steps have handrails. Any raised decks and porches should have guardrails on the edges. Have any leaves, snow, or ice cleared regularly. Use sand or salt on walking paths during winter. Clean up any spills in your garage right away. This includes oil or grease spills. What can I do in the bathroom? Use night lights. Install grab bars by the toilet and in the tub and shower. Do not use towel bars as grab bars. Use non-skid mats or decals in the tub or shower. If you need to sit down in the shower, use a plastic, non-slip stool. Keep the floor dry. Clean up any water that spills on the floor as soon as it happens. Remove soap buildup in the tub or shower  regularly. Attach bath mats securely with double-sided non-slip rug tape. Do not have throw rugs and other things on the floor that can make you trip. What can I do in the bedroom? Use night lights. Make sure that you have a light by your bed that is easy to reach. Do not use any sheets or blankets that are too big for your bed. They should not hang down onto the floor. Have a firm chair that has side arms. You can use this for support while you get dressed. Do not have throw rugs and other things on the floor that can make you trip. What can I do in the kitchen? Clean up any spills right away. Avoid walking on wet floors. Keep items that you use a lot in easy-to-reach places. If you need to reach something above you, use a strong step stool that has a grab bar. Keep electrical cords out of the way. Do not use floor polish or wax that makes floors slippery. If you must use wax, use non-skid floor wax. Do not have throw rugs and other things on the floor that can make you trip. What can I do with my stairs? Do not leave any items on the stairs. Make sure that there are handrails on both sides of the stairs and use them. Fix handrails that are broken or loose. Make sure that handrails are as long as the stairways. Check any carpeting to make sure that it is firmly  attached to the stairs. Fix any carpet that is loose or worn. Avoid having throw rugs at the top or bottom of the stairs. If you do have throw rugs, attach them to the floor with carpet tape. Make sure that you have a light switch at the top of the stairs and the bottom of the stairs. If you do not have them, ask someone to add them for you. What else can I do to help prevent falls? Wear shoes that: Do not have high heels. Have rubber bottoms. Are comfortable and fit you well. Are closed at the toe. Do not wear sandals. If you use a stepladder: Make sure that it is fully opened. Do not climb a closed stepladder. Make sure that  both sides of the stepladder are locked into place. Ask someone to hold it for you, if possible. Clearly mark and make sure that you can see: Any grab bars or handrails. First and last steps. Where the edge of each step is. Use tools that help you move around (mobility aids) if they are needed. These include: Canes. Walkers. Scooters. Crutches. Turn on the lights when you go into a dark area. Replace any light bulbs as soon as they burn out. Set up your furniture so you have a clear path. Avoid moving your furniture around. If any of your floors are uneven, fix them. If there are any pets around you, be aware of where they are. Review your medicines with your doctor. Some medicines can make you feel dizzy. This can increase your chance of falling. Ask your doctor what other things that you can do to help prevent falls. This information is not intended to replace advice given to you by your health care provider. Make sure you discuss any questions you have with your health care provider. Document Released: 07/08/2009 Document Revised: 02/17/2016 Document Reviewed: 10/16/2014 Elsevier Interactive Patient Education  2017 [employer].

## 2022-05-16 ENCOUNTER — Ambulatory Visit (HOSPITAL_BASED_OUTPATIENT_CLINIC_OR_DEPARTMENT_OTHER): Payer: Medicare HMO | Admitting: Obstetrics & Gynecology

## 2022-05-17 ENCOUNTER — Encounter: Payer: Self-pay | Admitting: Internal Medicine

## 2022-05-24 DIAGNOSIS — S0006XD Insect bite (nonvenomous) of scalp, subsequent encounter: Secondary | ICD-10-CM | POA: Diagnosis not present

## 2022-05-24 DIAGNOSIS — W57XXXD Bitten or stung by nonvenomous insect and other nonvenomous arthropods, subsequent encounter: Secondary | ICD-10-CM | POA: Diagnosis not present

## 2022-06-06 DIAGNOSIS — H43813 Vitreous degeneration, bilateral: Secondary | ICD-10-CM | POA: Diagnosis not present

## 2022-06-06 DIAGNOSIS — H524 Presbyopia: Secondary | ICD-10-CM | POA: Diagnosis not present

## 2022-06-18 ENCOUNTER — Other Ambulatory Visit: Payer: Self-pay | Admitting: Internal Medicine

## 2022-07-03 ENCOUNTER — Encounter: Payer: Medicare HMO | Admitting: Internal Medicine

## 2022-07-03 ENCOUNTER — Other Ambulatory Visit: Payer: Self-pay | Admitting: Internal Medicine

## 2022-07-07 ENCOUNTER — Encounter: Payer: Self-pay | Admitting: Internal Medicine

## 2022-07-10 ENCOUNTER — Encounter: Payer: Self-pay | Admitting: Internal Medicine

## 2022-07-10 NOTE — Progress Notes (Unsigned)
Subjective:    Patient ID: Angelica Patton, female    DOB: [birth year], 71 y.o.   MRN: [id number]      HPI Angelica Patton is here for a Physical exam.    Pulled muscle in back - did this 4 days ago.  Pain is on left side of lower back.  Better when up and moving.  She is taking ibuprofen and using topical pain reliever.  She denies numbness, tingling or radiation into the buttock or down the leg.  Otherwise doing good and has no concerns.  Medications and allergies reviewed with patient and updated if appropriate.  Current Outpatient Medications on File Prior to Visit  Medication Sig Dispense Refill   alendronate (FOSAMAX) 70 MG tablet TAKE 1 TABLET BY MOUTH EVERY 7 DAYS. TAKE WITH A FULL GLASS OF WATER ON AN EMPTY STOMACH. 12 tablet 3   aspirin 81 MG tablet Take 81 mg by mouth daily.     azelastine (ASTELIN) 0.1 % nasal spray Use in each nostril as directed 30 mL 0   cholecalciferol (VITAMIN D) 1000 UNITS tablet Take 1,000 Units by mouth daily.     escitalopram (LEXAPRO) 20 MG tablet TAKE 1 TABLET BY MOUTH EVERY DAY 90 tablet 3   levocetirizine (XYZAL) 5 MG tablet Take 1 tablet (5 mg total) by mouth every evening. Annual appt due in Oct must see provider for future refills 90 tablet 0   montelukast (SINGULAIR) 10 MG tablet Take 1 tablet (10 mg total) by mouth every evening. 90 tablet 1   Multiple Vitamins-Minerals (MULTIVITAMIN PO) Take by mouth.     Probiotic Product (ACIDOPHILUS/GOAT MILK) CAPS Take by mouth.     Turmeric (QC TUMERIC COMPLEX) 500 MG CAPS Taking daily     valACYclovir (VALTREX) 500 MG tablet Take 1 tablet (500 mg total) by mouth as needed. 30 tablet 3   No current facility-administered medications on file prior to visit.    Review of Systems  Constitutional:  Negative for fever.  Eyes:  Negative for visual disturbance.  Respiratory:  Negative for cough, shortness of breath and wheezing.   Cardiovascular:  Negative for chest pain, palpitations and leg swelling.   Gastrointestinal:  Negative for abdominal pain, blood in stool, constipation, diarrhea and nausea.       No gerd  Genitourinary:  Negative for dysuria.  Musculoskeletal:  Positive for back pain (pulled muscle in left lower back x 4 days). Negative for arthralgias.  Skin:  Negative for rash.  Neurological:  Negative for light-headedness and headaches.  Psychiatric/Behavioral:  Positive for dysphoric mood (controlled). The patient is nervous/anxious (controlled).        Objective:   Vitals:   07/11/22 1122  BP: 122/78  Pulse: 73  Temp: 98 F (36.7 C)  SpO2: 96%   Filed Weights   07/11/22 1122  Weight: 149 lb 12.8 oz (67.9 kg)   Body mass index is 24.55 kg/m.  BP Readings from Last 3 Encounters:  07/11/22 122/78  06/29/21 128/80  06/14/20 120/72    Wt Readings from Last 3 Encounters:  07/11/22 149 lb 12.8 oz (67.9 kg)  06/29/21 142 lb 8 oz (64.6 kg)  06/14/20 142 lb 12.8 oz (64.8 kg)       Physical Exam Constitutional: She appears well-developed and well-nourished. No distress.  HENT:  Head: Normocephalic and atraumatic.  Right Ear: External ear normal. Normal ear canal and TM Left Ear: External ear normal.  Normal ear canal and TM Mouth/Throat: Oropharynx is clear  and moist.  Eyes: Conjunctivae normal.  Neck: Neck supple. No tracheal deviation present. No thyromegaly present.  No carotid bruit  Cardiovascular: Normal rate, regular rhythm and normal heart sounds.   No murmur heard.  No edema. Pulmonary/Chest: Effort normal and breath sounds normal. No respiratory distress. She has no wheezes. She has no rales.  Breast: deferred   Abdominal: Soft. She exhibits no distension. There is no tenderness.  Lymphadenopathy: She has no cervical adenopathy.  Skin: Skin is warm and dry. She is not diaphoretic.  Psychiatric: She has a normal mood and affect. Her behavior is normal.     Lab Results  Component Value Date   WBC 7.0 06/29/2021   HGB 13.8 06/29/2021    HCT 42.1 06/29/2021   PLT 279.0 06/29/2021   GLUCOSE 97 06/29/2021   CHOL 203 (H) 06/29/2021   TRIG 49.0 06/29/2021   HDL 93.40 06/29/2021   LDLCALC 100 (H) 06/29/2021   ALT 14 06/29/2021   AST 16 06/29/2021   NA 140 06/29/2021   K 4.0 06/29/2021   CL 102 06/29/2021   CREATININE 0.73 06/29/2021   BUN 15 06/29/2021   CO2 30 06/29/2021   TSH 3.48 06/29/2021   HGBA1C 5.9 06/29/2021         Assessment & Plan:   Physical exam: Screening blood work  ordered Exercise  regular-riding a bike, goes to the gym in the wintertime Weight  normal Substance abuse  none   Reviewed recommended immunizations.   Health Maintenance  Topic Date Due   INFLUENZA VACCINE  04/25/2022   COVID-19 Vaccine (5 - Pfizer risk series) 07/27/2022 (Originally 09/07/2021)   TETANUS/TDAP  07/12/2023 (Originally 12/04/1969)   DEXA SCAN  06/30/2023   COLONOSCOPY (Pts 45-33yr Insurance coverage will need to be confirmed)  06/15/2024   Pneumonia Vaccine 69 Years old  Completed   Hepatitis C Screening  Completed   Zoster Vaccines- Shingrix  Completed   HPV VACCINES  Aged Out          See Problem List for Assessment and Plan of chronic medical problems.

## 2022-07-10 NOTE — Patient Instructions (Addendum)
Flu immunization administered today.      Blood work was ordered.   The lab in on the first floor.    Medications changes include :   none     Return in about 1 year (around 07/12/2023) for Physical Exam.     Health Maintenance, Female Adopting a healthy lifestyle and getting preventive care are important in promoting health and wellness. Ask your health care provider about: The right schedule for you to have regular tests and exams. Things you can do on your own to prevent diseases and keep yourself healthy. What should I know about diet, weight, and exercise? Eat a healthy diet  Eat a diet that includes plenty of vegetables, fruits, low-fat dairy products, and lean protein. Do not eat a lot of foods that are high in solid fats, added sugars, or sodium. Maintain a healthy weight Body mass index (BMI) is used to identify weight problems. It estimates body fat based on height and weight. Your health care provider can help determine your BMI and help you achieve or maintain a healthy weight. Get regular exercise Get regular exercise. This is one of the most important things you can do for your health. Most adults should: Exercise for at least 150 minutes each week. The exercise should increase your heart rate and make you sweat (moderate-intensity exercise). Do strengthening exercises at least twice a week. This is in addition to the moderate-intensity exercise. Spend less time sitting. Even light physical activity can be beneficial. Watch cholesterol and blood lipids Have your blood tested for lipids and cholesterol at 69 years of age, then have this test every 5 years. Have your cholesterol levels checked more often if: Your lipid or cholesterol levels are high. You are older than 69 years of age. You are at high risk for heart disease. What should I know about cancer screening? Depending on your health history and family history, you may need to have cancer screening  at various ages. This may include screening for: Breast cancer. Cervical cancer. Colorectal cancer. Skin cancer. Lung cancer. What should I know about heart disease, diabetes, and high blood pressure? Blood pressure and heart disease High blood pressure causes heart disease and increases the risk of stroke. This is more likely to develop in people who have high blood pressure readings or are overweight. Have your blood pressure checked: Every 3-5 years if you are 56-62 years of age. Every year if you are 40 years old or older. Diabetes Have regular diabetes screenings. This checks your fasting blood sugar level. Have the screening done: Once every three years after age 81 if you are at a normal weight and have a low risk for diabetes. More often and at a younger age if you are overweight or have a high risk for diabetes. What should I know about preventing infection? Hepatitis B If you have a higher risk for hepatitis B, you should be screened for this virus. Talk with your health care provider to find out if you are at risk for hepatitis B infection. Hepatitis C Testing is recommended for: Everyone born from [birth year] through 1963. Anyone with known risk factors for hepatitis C. Sexually transmitted infections (STIs) Get screened for STIs, including gonorrhea and chlamydia, if: You are sexually active and are younger than 69 years of age. You are older than 69 years of age and your health care provider tells you that you are at risk for this type of infection. Your sexual activity has  changed since you were last screened, and you are at increased risk for chlamydia or gonorrhea. Ask your health care provider if you are at risk. Ask your health care provider about whether you are at high risk for HIV. Your health care provider may recommend a prescription medicine to help prevent HIV infection. If you choose to take medicine to prevent HIV, you should first get tested for HIV. You should then  be tested every 3 months for as long as you are taking the medicine. Pregnancy If you are about to stop having your period (premenopausal) and you may become pregnant, seek counseling before you get pregnant. Take 400 to 800 micrograms (mcg) of folic acid every day if you become pregnant. Ask for birth control (contraception) if you want to prevent pregnancy. Osteoporosis and menopause Osteoporosis is a disease in which the bones lose minerals and strength with aging. This can result in bone fractures. If you are 55 years old or older, or if you are at risk for osteoporosis and fractures, ask your health care provider if you should: Be screened for bone loss. Take a calcium or vitamin D supplement to lower your risk of fractures. Be given hormone replacement therapy (HRT) to treat symptoms of menopause. Follow these instructions at home: Alcohol use Do not drink alcohol if: Your health care provider tells you not to drink. You are pregnant, may be pregnant, or are planning to become pregnant. If you drink alcohol: Limit how much you have to: 0-1 drink a day. Know how much alcohol is in your drink. In the [place] one drink equals one 12 oz bottle of beer (355 mL), one 5 oz glass of wine (148 mL), or one 1 oz glass of hard liquor (44 mL). Lifestyle Do not use any products that contain nicotine or tobacco. These products include cigarettes, chewing tobacco, and vaping devices, such as e-cigarettes. If you need help quitting, ask your health care provider. Do not use street drugs. Do not share needles. Ask your health care provider for help if you need support or information about quitting drugs. General instructions Schedule regular health, dental, and eye exams. Stay current with your vaccines. Tell your health care provider if: You often feel depressed. You have ever been abused or do not feel safe at home. Summary Adopting a healthy lifestyle and getting preventive care are important in  promoting health and wellness. Follow your health care provider's instructions about healthy diet, exercising, and getting tested or screened for diseases. Follow your health care provider's instructions on monitoring your cholesterol and blood pressure. This information is not intended to replace advice given to you by your health care provider. Make sure you discuss any questions you have with your health care provider. Document Revised: 01/31/2021 Document Reviewed: 01/31/2021 Elsevier Patient Education  [place].

## 2022-07-11 ENCOUNTER — Ambulatory Visit (INDEPENDENT_AMBULATORY_CARE_PROVIDER_SITE_OTHER): Payer: Medicare HMO | Admitting: Internal Medicine

## 2022-07-11 VITALS — BP 122/78 | HR 73 | Temp 98.0°F | Ht 65.5 in | Wt 149.8 lb

## 2022-07-11 DIAGNOSIS — Z23 Encounter for immunization: Secondary | ICD-10-CM | POA: Diagnosis not present

## 2022-07-11 DIAGNOSIS — F3289 Other specified depressive episodes: Secondary | ICD-10-CM

## 2022-07-11 DIAGNOSIS — R7303 Prediabetes: Secondary | ICD-10-CM | POA: Diagnosis not present

## 2022-07-11 DIAGNOSIS — R931 Abnormal findings on diagnostic imaging of heart and coronary circulation: Secondary | ICD-10-CM | POA: Diagnosis not present

## 2022-07-11 DIAGNOSIS — M810 Age-related osteoporosis without current pathological fracture: Secondary | ICD-10-CM

## 2022-07-11 DIAGNOSIS — Z0000 Encounter for general adult medical examination without abnormal findings: Secondary | ICD-10-CM | POA: Diagnosis not present

## 2022-07-11 DIAGNOSIS — F419 Anxiety disorder, unspecified: Secondary | ICD-10-CM | POA: Diagnosis not present

## 2022-07-11 DIAGNOSIS — Z136 Encounter for screening for cardiovascular disorders: Secondary | ICD-10-CM | POA: Diagnosis not present

## 2022-07-11 DIAGNOSIS — R69 Illness, unspecified: Secondary | ICD-10-CM | POA: Diagnosis not present

## 2022-07-11 LAB — CBC WITH DIFFERENTIAL/PLATELET
Basophils Absolute: 0.1 10*3/uL (ref 0.0–0.1)
Basophils Relative: 0.7 % (ref 0.0–3.0)
Eosinophils Absolute: 0.1 10*3/uL (ref 0.0–0.7)
Eosinophils Relative: 0.7 % (ref 0.0–5.0)
HCT: 40.5 % (ref 36.0–46.0)
Hemoglobin: 13.4 g/dL (ref 12.0–15.0)
Lymphocytes Relative: 33.3 % (ref 12.0–46.0)
Lymphs Abs: 2.7 10*3/uL (ref 0.7–4.0)
MCHC: 33.1 g/dL (ref 30.0–36.0)
MCV: 92.7 fl (ref 78.0–100.0)
Monocytes Absolute: 0.4 10*3/uL (ref 0.1–1.0)
Monocytes Relative: 5.5 % (ref 3.0–12.0)
Neutro Abs: 4.9 10*3/uL (ref 1.4–7.7)
Neutrophils Relative %: 59.8 % (ref 43.0–77.0)
Platelets: 232 10*3/uL (ref 150.0–400.0)
RBC: 4.37 Mil/uL (ref 3.87–5.11)
RDW: 13.5 % (ref 11.5–15.5)
WBC: 8.2 10*3/uL (ref 4.0–10.5)

## 2022-07-11 LAB — COMPREHENSIVE METABOLIC PANEL
ALT: 19 U/L (ref 0–35)
AST: 20 U/L (ref 0–37)
Albumin: 4.5 g/dL (ref 3.5–5.2)
Alkaline Phosphatase: 32 U/L — ABNORMAL LOW (ref 39–117)
BUN: 14 mg/dL (ref 6–23)
CO2: 29 mEq/L (ref 19–32)
Calcium: 9.3 mg/dL (ref 8.4–10.5)
Chloride: 103 mEq/L (ref 96–112)
Creatinine, Ser: 0.69 mg/dL (ref 0.40–1.20)
GFR: 88.44 mL/min (ref >60.00)
Glucose, Bld: 83 mg/dL (ref 70–99)
Potassium: 4 mEq/L (ref 3.5–5.1)
Sodium: 139 mEq/L (ref 135–145)
Total Bilirubin: 0.5 mg/dL (ref 0.2–1.2)
Total Protein: 6.6 g/dL (ref 6.0–8.3)

## 2022-07-11 LAB — LIPID PANEL
Cholesterol: 201 mg/dL — ABNORMAL HIGH (ref 0–200)
HDL: 102.4 mg/dL (ref >39.00)
LDL Cholesterol: 90 mg/dL (ref 0–99)
NonHDL: 98.3
Total CHOL/HDL Ratio: 2
Triglycerides: 40 mg/dL (ref 0.0–149.0)
VLDL: 8 mg/dL (ref 0.0–40.0)

## 2022-07-11 LAB — TSH: TSH: 2.17 u[IU]/mL (ref 0.35–5.50)

## 2022-07-11 LAB — VITAMIN D 25 HYDROXY (VIT D DEFICIENCY, FRACTURES): VITD: 80.17 ng/mL (ref 30.00–100.00)

## 2022-07-11 LAB — HEMOGLOBIN A1C: Hgb A1c MFr Bld: 5.6 % (ref 4.6–6.5)

## 2022-07-11 MED ORDER — ALPRAZOLAM 0.25 MG PO TABS: ORAL_TABLET | ORAL | 0 refills | Status: DC

## 2022-07-11 NOTE — Addendum Note (Signed)
Addended by: Marcina Millard on: 07/11/2022 04:51 PM   Modules accepted: Orders

## 2022-07-11 NOTE — Assessment & Plan Note (Signed)
Chronic Check a1c Low sugar / carb diet Stressed regular exercise  

## 2022-07-11 NOTE — Assessment & Plan Note (Addendum)
Chronic Controlled, Stable Check TSH Continue Lexapro 20 mg daily, alprazolam 0.25 mg nightly as needed

## 2022-07-11 NOTE — Assessment & Plan Note (Signed)
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

## 2022-07-11 NOTE — Assessment & Plan Note (Addendum)
Chronic Controlled, Stable Check TSH Continue Lexapro 20 mg daily

## 2022-07-11 NOTE — Assessment & Plan Note (Addendum)
Chronic DEXA up-to-date Has been on Fosamax for 1 year-tolerating well-plan to continue for 5 years Currently taking D3 1000 units a day Taking calcium daily Continue regular exercise Check vitamin D level, CBC, CMP, TSH

## 2022-07-26 ENCOUNTER — Ambulatory Visit (HOSPITAL_BASED_OUTPATIENT_CLINIC_OR_DEPARTMENT_OTHER): Payer: Medicare HMO | Admitting: Obstetrics & Gynecology

## 2022-07-26 ENCOUNTER — Encounter (HOSPITAL_BASED_OUTPATIENT_CLINIC_OR_DEPARTMENT_OTHER): Payer: Self-pay

## 2022-07-28 ENCOUNTER — Ambulatory Visit (HOSPITAL_BASED_OUTPATIENT_CLINIC_OR_DEPARTMENT_OTHER): Payer: Medicare HMO | Admitting: Obstetrics & Gynecology

## 2022-08-10 ENCOUNTER — Other Ambulatory Visit: Payer: Self-pay | Admitting: Internal Medicine

## 2022-08-20 DIAGNOSIS — J189 Pneumonia, unspecified organism: Secondary | ICD-10-CM | POA: Diagnosis not present

## 2022-08-20 DIAGNOSIS — R062 Wheezing: Secondary | ICD-10-CM | POA: Diagnosis not present

## 2022-08-20 DIAGNOSIS — R051 Acute cough: Secondary | ICD-10-CM | POA: Diagnosis not present

## 2022-08-25 DIAGNOSIS — H9203 Otalgia, bilateral: Secondary | ICD-10-CM | POA: Diagnosis not present

## 2022-08-25 DIAGNOSIS — R051 Acute cough: Secondary | ICD-10-CM | POA: Diagnosis not present

## 2022-09-07 ENCOUNTER — Encounter: Payer: Self-pay | Admitting: Internal Medicine

## 2022-09-07 NOTE — Telephone Encounter (Signed)
I called patient today to offer appointment with Vickie next week.  Patient declined and only wanted to see Dr. Quay Burow.  Patient made aware that Dr. Quay Burow was not in the office on Thursday of next week and I could get her in on Tuesday but patient declined appointment.   Patient said she felt better and would be ok.

## 2022-10-03 ENCOUNTER — Other Ambulatory Visit: Payer: Self-pay | Admitting: Internal Medicine

## 2022-10-16 DIAGNOSIS — Z0100 Encounter for examination of eyes and vision without abnormal findings: Secondary | ICD-10-CM | POA: Diagnosis not present

## 2022-10-25 DIAGNOSIS — L570 Actinic keratosis: Secondary | ICD-10-CM | POA: Diagnosis not present

## 2022-10-25 DIAGNOSIS — L821 Other seborrheic keratosis: Secondary | ICD-10-CM | POA: Diagnosis not present

## 2022-10-25 DIAGNOSIS — L814 Other melanin hyperpigmentation: Secondary | ICD-10-CM | POA: Diagnosis not present

## 2022-10-25 DIAGNOSIS — Z85828 Personal history of other malignant neoplasm of skin: Secondary | ICD-10-CM | POA: Diagnosis not present

## 2022-10-25 DIAGNOSIS — D225 Melanocytic nevi of trunk: Secondary | ICD-10-CM | POA: Diagnosis not present

## 2022-10-25 DIAGNOSIS — I788 Other diseases of capillaries: Secondary | ICD-10-CM | POA: Diagnosis not present

## 2022-10-25 DIAGNOSIS — Z08 Encounter for follow-up examination after completed treatment for malignant neoplasm: Secondary | ICD-10-CM | POA: Diagnosis not present

## 2022-11-15 ENCOUNTER — Ambulatory Visit (HOSPITAL_BASED_OUTPATIENT_CLINIC_OR_DEPARTMENT_OTHER): Payer: Medicare HMO | Admitting: Obstetrics & Gynecology

## 2022-12-05 ENCOUNTER — Encounter (HOSPITAL_BASED_OUTPATIENT_CLINIC_OR_DEPARTMENT_OTHER): Payer: Self-pay

## 2022-12-05 ENCOUNTER — Ambulatory Visit (HOSPITAL_BASED_OUTPATIENT_CLINIC_OR_DEPARTMENT_OTHER): Payer: Medicare HMO | Admitting: Obstetrics & Gynecology

## 2022-12-09 ENCOUNTER — Encounter: Payer: Self-pay | Admitting: Internal Medicine

## 2022-12-09 ENCOUNTER — Other Ambulatory Visit: Payer: Self-pay | Admitting: Internal Medicine

## 2022-12-11 ENCOUNTER — Other Ambulatory Visit: Payer: Self-pay

## 2022-12-11 MED ORDER — AZELASTINE HCL 0.1 % NA SOLN: NASAL | 5 refills | Status: DC

## 2022-12-11 MED ORDER — ALPRAZOLAM 0.25 MG PO TABS: ORAL_TABLET | ORAL | 0 refills | Status: AC

## 2022-12-11 MED ORDER — VALACYCLOVIR HCL 500 MG PO TABS: 500.0000 mg | ORAL_TABLET | ORAL | 3 refills | Status: AC | PRN

## 2022-12-13 ENCOUNTER — Other Ambulatory Visit: Payer: Self-pay | Admitting: Internal Medicine

## 2022-12-13 MED ORDER — MONTELUKAST SODIUM 10 MG PO TABS: 10.0000 mg | ORAL_TABLET | Freq: Every day | ORAL | 1 refills | Status: AC

## 2022-12-13 NOTE — Addendum Note (Signed)
Addended by: Earnstine Regal on: 12/13/2022 11:39 AM   Modules accepted: Orders

## 2022-12-26 NOTE — Addendum Note (Signed)
Addended by: Earnstine Regal on: 12/26/2022 04:18 PM   Modules accepted: Orders

## 2023-01-18 ENCOUNTER — Encounter (HOSPITAL_BASED_OUTPATIENT_CLINIC_OR_DEPARTMENT_OTHER): Payer: Self-pay | Admitting: Obstetrics & Gynecology

## 2023-01-18 ENCOUNTER — Other Ambulatory Visit (HOSPITAL_COMMUNITY)
Admission: RE | Admit: 2023-01-18 | Discharge: 2023-01-18 | Disposition: A | Discharge status: Home / Self Care | Payer: Medicare HMO | Source: Ambulatory Visit | Attending: Obstetrics & Gynecology | Admitting: Obstetrics & Gynecology

## 2023-01-18 ENCOUNTER — Ambulatory Visit (INDEPENDENT_AMBULATORY_CARE_PROVIDER_SITE_OTHER): Payer: Medicare HMO | Admitting: Obstetrics & Gynecology

## 2023-01-18 VITALS — BP 115/81 | HR 101 | Ht 65.5 in | Wt 150.0 lb

## 2023-01-18 DIAGNOSIS — N3941 Urge incontinence: Secondary | ICD-10-CM | POA: Insufficient documentation

## 2023-01-18 DIAGNOSIS — Z01419 Encounter for gynecological examination (general) (routine) without abnormal findings: Secondary | ICD-10-CM | POA: Diagnosis not present

## 2023-01-18 DIAGNOSIS — B977 Papillomavirus as the cause of diseases classified elsewhere: Secondary | ICD-10-CM | POA: Diagnosis not present

## 2023-01-18 DIAGNOSIS — M6289 Other specified disorders of muscle: Secondary | ICD-10-CM | POA: Diagnosis not present

## 2023-01-18 DIAGNOSIS — M810 Age-related osteoporosis without current pathological fracture: Secondary | ICD-10-CM

## 2023-01-18 DIAGNOSIS — R151 Fecal smearing: Secondary | ICD-10-CM | POA: Diagnosis not present

## 2023-01-18 DIAGNOSIS — B009 Herpesviral infection, unspecified: Secondary | ICD-10-CM | POA: Diagnosis not present

## 2023-01-18 DIAGNOSIS — R1032 Left lower quadrant pain: Secondary | ICD-10-CM | POA: Diagnosis not present

## 2023-01-18 DIAGNOSIS — Z124 Encounter for screening for malignant neoplasm of cervix: Secondary | ICD-10-CM | POA: Diagnosis not present

## 2023-01-18 DIAGNOSIS — Z1151 Encounter for screening for human papillomavirus (HPV): Secondary | ICD-10-CM | POA: Diagnosis not present

## 2023-01-18 MED ORDER — MIRABEGRON ER 50 MG PO TB24: 50.0000 mg | ORAL_TABLET | Freq: Every day | ORAL | 2 refills | Status: DC

## 2023-01-18 NOTE — Progress Notes (Addendum)
72 y.o. G70P1200 Married White or Caucasian female here for new patient exam.  She is a former patient and I haven't seen her since 2019.  She lives in [place], [place].  She's come back for this appt for several reasons.  1)  She reports she's been having urinary incontinence and some fecal incontinence as well.  She saw Cheryll Cockayne last October who suggested she start metamucil.  This has helped with bulking her stool and decreasing her diarrhea.  Last colonoscopy was 2015.    Urinary incontinence is mostly present with urinary urgency.  She does not typically leak with coughing, laughing or lifting.  Denies vaginal bleeding.  2).  She is having some intermittent LLQ pain.  Will plan imaging.  If negative, may need repeat colonoscopy.  Saw Dr. Loreta Ave last 08/09/2021.  3)  has hx of +HR HPV.  Was told by provider where living that pap smears not needed.  Most recent high risk HPV testing was 09/02/2018.   Guidelines reviewed.  She does not meet criteria for being low risk and stopping pap smears.  Will obtain today.  Denies vaginal bleeding.  BMD 06/2021.  Has osteoporosis.  On Vit D and calcium.  Also on Fosamax.  Doing fine with this.  Reviewed BMD results.   No LMP recorded. Patient is postmenopausal.          Sexually active: Yes.    H/o +HR HPV  Colonoscopy:  06/15/2014 MMG: not indicated, s/p bilateral mastectomies 2011 BMD:  06/29/2021  Last pap smear:  09/02/2018 HPV positive.   H/o abnormal pap smear:  +HR HPV   reports that she quit smoking about 10 years ago. Her smoking use included cigarettes. She has never used smokeless tobacco. She reports current alcohol use of about 5.0 standard drinks of alcohol per week. She reports that she does not use drugs.  Past Medical History:  Diagnosis Date   Anxiety    Breast cancer    s/p double mastectomy 01/2010   Depression    Hypertension    Osteoporosis    STD (sexually transmitted disease)    Herpes    Past Surgical History:   Procedure Laterality Date   COLPOSCOPY     cone procedure  2010   unsure about date   double mastectomy     KNEE SURGERY     MASTECTOMY  01/25/2010   Bilateral     Current Outpatient Medications  Medication Sig Dispense Refill   alendronate (FOSAMAX) 70 MG tablet TAKE 1 TABLET BY MOUTH EVERY 7 DAYS. TAKE WITH A FULL GLASS OF WATER ON AN EMPTY STOMACH. 12 tablet 3   ALPRAZolam (XANAX) 0.25 MG tablet TAKE 1 TABLET BY MOUTH AT BEDTIME AS NEEDED FOR ANXIETY 30 tablet 0   aspirin 81 MG tablet Take 81 mg by mouth daily.     azelastine (ASTELIN) 0.1 % nasal spray Use in each nostril as directed 30 mL 5   cholecalciferol (VITAMIN D) 1000 UNITS tablet Take 1,000 Units by mouth daily.     escitalopram (LEXAPRO) 20 MG tablet TAKE 1 TABLET BY MOUTH EVERY DAY 90 tablet 3   levocetirizine (XYZAL) 5 MG tablet TAKE 1 TABLET (5 MG TOTAL) BY MOUTH EVERY EVENING. ANNUAL APPT DUE IN OCT MUST SEE PROVIDER FOR FUTURE REFILLS 90 tablet 2   montelukast (SINGULAIR) 10 MG tablet Take 1 tablet (10 mg total) by mouth at bedtime. 90 tablet 1   Multiple Vitamins-Minerals (MULTIVITAMIN PO) Take by mouth.  valACYclovir (VALTREX) 500 MG tablet Take 1 tablet (500 mg total) by mouth as needed. 30 tablet 3   No current facility-administered medications for this visit.    Family History  Problem Relation Age of Onset   Ulcers Father    Alcohol abuse Father    Heart failure Mother    Alcohol abuse Mother    Heart disease Mother    Depression Sister    Depression Daughter    Cancer Paternal Grandmother     Review of Systems  Constitutional: Negative.   Genitourinary: Negative.     Exam:   BP 115/81 (BP Location: Left Arm, Patient Position: Sitting, Cuff Size: Large)   Pulse (!) 101   Ht 5' 5.5" (1.664 m) Comment: Reported  Wt 150 lb (68 kg)   BMI 24.58 kg/m   Height: 5' 5.5" (166.4 cm) (Reported)  General appearance: alert, cooperative and appears stated age Breasts: normal appearance, no masses or  tenderness Abdomen: soft, non-tender; bowel sounds normal; no masses,  no organomegaly Lymph nodes: Cervical, supraclavicular, and axillary nodes normal.  No abnormal inguinal nodes palpated Neurologic: Grossly normal  Pelvic: External genitalia:  no lesions              Urethra:  normal appearing urethra with no masses, tenderness or lesions              Bartholins and Skenes: normal                 Vagina: normal appearing vagina with atrophic changes and no discharge, no lesions, second degree cystocele, incomplete uterine prolapse noted as well              Cervix: no lesions              Pap taken: Yes.   Bimanual Exam:  Uterus:  normal size, contour, position, consistency, mobility, non-tender              Adnexa: normal adnexa and no mass, fullness, tenderness               Rectovaginal: Confirms               Anus:  normal sphincter tone, no lesions  Asked pt to do a Kegel today and she bears down before actually doing the Kegel.  Feel she would benefit from pelvic PT.  Chaperone, Ina Homes, CMA, was present for exam.  Assessment/Plan: 1. Urge incontinence - will start myrbetriq 25mg  daily.  Hypertension side effect discussed.  Pt advised to call if this is costly. - feel she needs pelvic PT - does have cystocele but only 2nd degree on exam today.  2. Fecal smearing  3. Pelvic floor dysfunction   4. LLQ pain - will need to order imaging of ovaries  5. High risk HPV infection - Cytology - PAP( [facility])  6. Cervical cancer screening - Cytology - PAP( [facility])  7. Age-related osteoporosis without current pathological fracture - on fosamax and calcium/vit D  8. HSV infection - Uses valtrex

## 2023-01-19 ENCOUNTER — Encounter (HOSPITAL_BASED_OUTPATIENT_CLINIC_OR_DEPARTMENT_OTHER): Payer: Self-pay | Admitting: Obstetrics & Gynecology

## 2023-01-21 DIAGNOSIS — B977 Papillomavirus as the cause of diseases classified elsewhere: Secondary | ICD-10-CM | POA: Insufficient documentation

## 2023-01-21 DIAGNOSIS — M6289 Other specified disorders of muscle: Secondary | ICD-10-CM | POA: Insufficient documentation

## 2023-01-21 DIAGNOSIS — N3941 Urge incontinence: Secondary | ICD-10-CM | POA: Insufficient documentation

## 2023-01-21 DIAGNOSIS — R1032 Left lower quadrant pain: Secondary | ICD-10-CM | POA: Insufficient documentation

## 2023-01-21 DIAGNOSIS — R151 Fecal smearing: Secondary | ICD-10-CM | POA: Insufficient documentation

## 2023-01-22 ENCOUNTER — Other Ambulatory Visit (HOSPITAL_BASED_OUTPATIENT_CLINIC_OR_DEPARTMENT_OTHER): Payer: Self-pay | Admitting: Obstetrics & Gynecology

## 2023-01-22 DIAGNOSIS — R102 Pelvic and perineal pain: Secondary | ICD-10-CM

## 2023-01-22 DIAGNOSIS — R3915 Urgency of urination: Secondary | ICD-10-CM

## 2023-01-22 DIAGNOSIS — N3941 Urge incontinence: Secondary | ICD-10-CM

## 2023-01-23 LAB — CYTOLOGY - PAP
Comment: NEGATIVE
Comment: NEGATIVE
Comment: NEGATIVE
Diagnosis: NEGATIVE
HPV 16: NEGATIVE
HPV 18 / 45: NEGATIVE
High risk HPV: POSITIVE — AB

## 2023-02-01 DIAGNOSIS — R102 Pelvic and perineal pain: Secondary | ICD-10-CM | POA: Diagnosis not present

## 2023-02-01 DIAGNOSIS — R9389 Abnormal findings on diagnostic imaging of other specified body structures: Secondary | ICD-10-CM | POA: Diagnosis not present

## 2023-02-01 DIAGNOSIS — N858 Other specified noninflammatory disorders of uterus: Secondary | ICD-10-CM | POA: Diagnosis not present

## 2023-02-27 ENCOUNTER — Encounter: Payer: Self-pay | Admitting: Internal Medicine

## 2023-03-05 ENCOUNTER — Ambulatory Visit (INDEPENDENT_AMBULATORY_CARE_PROVIDER_SITE_OTHER): Payer: Medicare HMO | Admitting: Obstetrics & Gynecology

## 2023-03-05 ENCOUNTER — Ambulatory Visit: Payer: Medicare HMO | Admitting: Internal Medicine

## 2023-03-05 ENCOUNTER — Encounter (HOSPITAL_BASED_OUTPATIENT_CLINIC_OR_DEPARTMENT_OTHER): Payer: Self-pay | Admitting: Obstetrics & Gynecology

## 2023-03-05 ENCOUNTER — Other Ambulatory Visit (HOSPITAL_COMMUNITY)
Admission: RE | Admit: 2023-03-05 | Discharge: 2023-03-05 | Disposition: A | Discharge status: Home / Self Care | Payer: Medicare HMO | Source: Ambulatory Visit | Attending: Obstetrics & Gynecology | Admitting: Obstetrics & Gynecology

## 2023-03-05 VITALS — BP 147/80 | HR 76 | Ht 64.75 in | Wt 156.4 lb

## 2023-03-05 DIAGNOSIS — R151 Fecal smearing: Secondary | ICD-10-CM | POA: Diagnosis not present

## 2023-03-05 DIAGNOSIS — B977 Papillomavirus as the cause of diseases classified elsewhere: Secondary | ICD-10-CM | POA: Diagnosis not present

## 2023-03-05 DIAGNOSIS — N812 Incomplete uterovaginal prolapse: Secondary | ICD-10-CM | POA: Diagnosis not present

## 2023-03-05 DIAGNOSIS — N870 Mild cervical dysplasia: Secondary | ICD-10-CM

## 2023-03-05 DIAGNOSIS — R87810 Cervical high risk human papillomavirus (HPV) DNA test positive: Secondary | ICD-10-CM | POA: Insufficient documentation

## 2023-03-05 DIAGNOSIS — M6289 Other specified disorders of muscle: Secondary | ICD-10-CM | POA: Diagnosis not present

## 2023-03-05 DIAGNOSIS — N3941 Urge incontinence: Secondary | ICD-10-CM

## 2023-03-05 DIAGNOSIS — R87612 Low grade squamous intraepithelial lesion on cytologic smear of cervix (LGSIL): Secondary | ICD-10-CM | POA: Diagnosis not present

## 2023-03-05 NOTE — Progress Notes (Signed)
72 y.o. G16P1200 Married White female here for colposcopy with possible biopsies and/or ECC due to +HR HPV with normal Pap obtained 11/19/2022.  H/O LEEP due to cervical dysplasia.  16/18/45 testing was negative.  D/w pt findings and reasoning for colposcopy.  She did have ultrasound done in [place].  Never received results.  I can see in CareEverywhere today and this is normal.  Ovaries were note well visualized.  She is seeing GI, Dr. Loreta Ave, for additional evaluation of fecal smearing.  She is considering possible treatment for cystocele, incomplete uterine prolapse, fecal smearing.  Would like referral to provider in [place], where she lives.   No LMP recorded. Patient is postmenopausal.           Patient has been counseled about results and procedure.  Risks and benefits have bene reviewed including immediate and/or delayed bleeding, infection, cervical scaring from procedure, possibility of needing additional follow up as well as treatment.  Rare risks of missing a lesion discussed as well.  All questions answered.  Pt ready to proceed.  Consent obtained.  BP (!) 147/80 (BP Location: Right Arm, Patient Position: Sitting, Cuff Size: Large)   Pulse 76   Ht 5' 4.75" (1.645 m)   Wt 156 lb 6.4 oz (70.9 kg)   BMI 26.23 kg/m   General appearance: alert, cooperative and appears stated age Lymph nodes: No abnormal inguinal nodes palpated Neurologic: Grossly normal  Pelvic: External genitalia:  no lesions              Urethra:  normal appearing urethra with no masses, tenderness or lesions              Bartholins and Skenes: normal                 Vagina: normal appearing vagina with normal color and no discharge, no lesions                Speculum placed.  3% acetic acid applied to cervix for >45 seconds.  Cervix visualized with both 7.5X and 15X magnification.  Green filter also used.  Lugols solution was not used.  Findings:  no AWE noted.  Biopsy:  none obtained.  Cervical stenosis was  noted but cervix dilation and then ECC was obtained.  Monsel's was not needed.  Excellent hemostasis was present.  Pt tolerated procedure well and all instruments were removed.   Chaperone, Ina Homes, was present during procedure.  Assessment/Plan: 1. High risk HPV infection - Surgical pathology( [facility]/ [facility]) - Pathology results will be called to patient and follow-up planned pending results.  2. Urge incontinence - on myrbetriq  3. Fecal smearing - seeing GI  4. Pelvic floor dysfunction - pt was referred to PT.  Appt not until October.  She is working with a Psychologist, educational to help with specific exercises.  5. Incomplete uterine prolapse - Ambulatory referral to Urogynecology

## 2023-03-05 NOTE — Patient Instructions (Addendum)
Marvetta Gibbons, MD [facility] - [address] [facility] [phone] Text or Call [phone] Fax  [place] - [address] [facility] [phone] Text or Call [phone] Fax

## 2023-03-07 LAB — SURGICAL PATHOLOGY

## 2023-03-11 ENCOUNTER — Encounter: Payer: Self-pay | Admitting: Internal Medicine

## 2023-03-11 NOTE — Progress Notes (Unsigned)
Subjective:    Patient ID: Angelica Patton, female    DOB: May 10, 1951, 72 y.o.   MRN: [id number]      HPI Aubrei is here for No chief complaint on file.   Elevated BP = 147/90 last week at gyn office.  At home - 131/90, 124/79, 133/87, 128/76, 132/88  Has had some headaches and she knows that is when her BP is  elevated .   Medications and allergies reviewed with patient and updated if appropriate.  Current Outpatient Medications on File Prior to Visit  Medication Sig Dispense Refill   alendronate (FOSAMAX) 70 MG tablet TAKE 1 TABLET BY MOUTH EVERY 7 DAYS. TAKE WITH A FULL GLASS OF WATER ON AN EMPTY STOMACH. 12 tablet 3   ALPRAZolam (XANAX) 0.25 MG tablet TAKE 1 TABLET BY MOUTH AT BEDTIME AS NEEDED FOR ANXIETY 30 tablet 0   aspirin 81 MG tablet Take 81 mg by mouth daily.     azelastine (ASTELIN) 0.1 % nasal spray Use in each nostril as directed 30 mL 5   cholecalciferol (VITAMIN D) 1000 UNITS tablet Take 1,000 Units by mouth daily.     escitalopram (LEXAPRO) 20 MG tablet TAKE 1 TABLET BY MOUTH EVERY DAY 90 tablet 3   levocetirizine (XYZAL) 5 MG tablet TAKE 1 TABLET (5 MG TOTAL) BY MOUTH EVERY EVENING. ANNUAL APPT DUE IN OCT MUST SEE PROVIDER FOR FUTURE REFILLS 90 tablet 2   montelukast (SINGULAIR) 10 MG tablet Take 1 tablet (10 mg total) by mouth at bedtime. 90 tablet 1   Multiple Vitamins-Minerals (MULTIVITAMIN PO) Take by mouth.     valACYclovir (VALTREX) 500 MG tablet Take 1 tablet (500 mg total) by mouth as needed. 30 tablet 3   mirabegron ER (MYRBETRIQ) 50 MG TB24 tablet Take 1 tablet (50 mg total) by mouth daily. One po qd 30 tablet 2   No current facility-administered medications on file prior to visit.    Review of Systems  Respiratory:  Negative for shortness of breath.   Cardiovascular:  Negative for chest pain, palpitations and leg swelling.  Neurological:  Positive for light-headedness (occ) and headaches.       Objective:   Vitals:   03/12/23 1332  BP:  138/78  Pulse: 80  Temp: 97.8 F (36.6 C)  SpO2: 97%   BP Readings from Last 3 Encounters:  03/12/23 138/78  03/05/23 (!) 147/80  01/18/23 115/81   Wt Readings from Last 3 Encounters:  03/12/23 157 lb (71.2 kg)  03/05/23 156 lb 6.4 oz (70.9 kg)  01/18/23 150 lb (68 kg)   Body mass index is 26.53 kg/m.    Physical Exam Constitutional:      General: She is not in acute distress.    Appearance: Normal appearance.  HENT:     Head: Normocephalic and atraumatic.  Eyes:     Conjunctiva/sclera: Conjunctivae normal.  Cardiovascular:     Rate and Rhythm: Normal rate and regular rhythm.     Heart sounds: Normal heart sounds.  Pulmonary:     Effort: Pulmonary effort is normal. No respiratory distress.     Breath sounds: Normal breath sounds. No wheezing.  Musculoskeletal:     Cervical back: Neck supple.     Right lower leg: No edema.     Left lower leg: No edema.  Lymphadenopathy:     Cervical: No cervical adenopathy.  Skin:    General: Skin is warm and dry.     Findings: No rash.  Neurological:  Mental Status: She is alert. Mental status is at baseline.  Psychiatric:        Mood and Affect: Mood normal.        Behavior: Behavior normal.            Assessment & Plan:    See Problem List for Assessment and Plan of chronic medical problems.

## 2023-03-12 ENCOUNTER — Ambulatory Visit (INDEPENDENT_AMBULATORY_CARE_PROVIDER_SITE_OTHER): Payer: Medicare HMO | Admitting: Internal Medicine

## 2023-03-12 ENCOUNTER — Other Ambulatory Visit (HOSPITAL_BASED_OUTPATIENT_CLINIC_OR_DEPARTMENT_OTHER): Payer: Self-pay | Admitting: *Deleted

## 2023-03-12 VITALS — BP 138/78 | HR 80 | Temp 97.8°F | Ht 64.5 in | Wt 157.0 lb

## 2023-03-12 DIAGNOSIS — R3915 Urgency of urination: Secondary | ICD-10-CM

## 2023-03-12 DIAGNOSIS — I10 Essential (primary) hypertension: Secondary | ICD-10-CM | POA: Diagnosis not present

## 2023-03-12 DIAGNOSIS — N3941 Urge incontinence: Secondary | ICD-10-CM

## 2023-03-12 MED ORDER — VALSARTAN 40 MG PO TABS: 40.0000 mg | ORAL_TABLET | Freq: Every day | ORAL | 1 refills | Status: DC

## 2023-03-12 NOTE — Assessment & Plan Note (Signed)
H/o Htn- has been off medication for a  while - recently BP has been elevated and she has been having headaches Need to restart medication and she agrees Restart valsartan 40 mg daily, which is what she was on in the past and did well with Monitor BP at home

## 2023-03-12 NOTE — Progress Notes (Signed)
Pt called requesting referral to different pelvic floor therapist. She is having trouble getting an appt soon where current referral was sent. She asks that new referral be sent to [facility], fax # [phone]. Advised that referral would be faxed.

## 2023-03-12 NOTE — Patient Instructions (Signed)
     Medications changes include :   diovan 40 mg daily

## 2023-03-22 DIAGNOSIS — D492 Neoplasm of unspecified behavior of bone, soft tissue, and skin: Secondary | ICD-10-CM | POA: Diagnosis not present

## 2023-03-22 DIAGNOSIS — C44722 Squamous cell carcinoma of skin of right lower limb, including hip: Secondary | ICD-10-CM | POA: Diagnosis not present

## 2023-03-22 DIAGNOSIS — L814 Other melanin hyperpigmentation: Secondary | ICD-10-CM | POA: Diagnosis not present

## 2023-03-22 DIAGNOSIS — L821 Other seborrheic keratosis: Secondary | ICD-10-CM | POA: Diagnosis not present

## 2023-04-05 DIAGNOSIS — R159 Full incontinence of feces: Secondary | ICD-10-CM | POA: Diagnosis not present

## 2023-04-05 DIAGNOSIS — N3941 Urge incontinence: Secondary | ICD-10-CM | POA: Diagnosis not present

## 2023-04-05 DIAGNOSIS — N3946 Mixed incontinence: Secondary | ICD-10-CM | POA: Diagnosis not present

## 2023-04-05 DIAGNOSIS — N393 Stress incontinence (female) (male): Secondary | ICD-10-CM | POA: Diagnosis not present

## 2023-05-07 ENCOUNTER — Other Ambulatory Visit: Payer: Self-pay | Admitting: Internal Medicine

## 2023-05-09 DIAGNOSIS — L905 Scar conditions and fibrosis of skin: Secondary | ICD-10-CM | POA: Diagnosis not present

## 2023-05-09 DIAGNOSIS — Z85828 Personal history of other malignant neoplasm of skin: Secondary | ICD-10-CM | POA: Diagnosis not present

## 2023-05-09 DIAGNOSIS — C44722 Squamous cell carcinoma of skin of right lower limb, including hip: Secondary | ICD-10-CM | POA: Diagnosis not present

## 2023-05-09 HISTORY — PX: OTHER SURGICAL HISTORY: SHX169

## 2023-05-29 ENCOUNTER — Ambulatory Visit (INDEPENDENT_AMBULATORY_CARE_PROVIDER_SITE_OTHER): Payer: Medicare HMO

## 2023-05-29 VITALS — Ht 65.0 in | Wt 153.0 lb

## 2023-05-29 DIAGNOSIS — Z0000 Encounter for general adult medical examination without abnormal findings: Secondary | ICD-10-CM | POA: Diagnosis not present

## 2023-05-29 NOTE — Progress Notes (Addendum)
Subjective:   Angelica Patton is a 72 y.o. female who presents for Medicare Annual (Subsequent) preventive examination.  Visit Complete: Virtual  I connected with  Angelica Patton on 05/29/23 by a audio enabled telemedicine application and verified that I am speaking with the correct person using two identifiers.  Patient Location: Home  Provider Location: Home Office  I discussed the limitations of evaluation and management by telemedicine. The patient expressed understanding and agreed to proceed.  Patient Medicare AWV questionnaire was completed by the patient on 05/25/2023; I have confirmed that all information answered by patient is correct and no changes since this date.  Vital Signs: Because this visit was a virtual/telehealth visit, some criteria may be missing or patient reported. Any vitals not documented were not able to be obtained and vitals that have been documented are patient reported.    Review of Systems   Cardiac Risk Factors include: advanced age (>28men, >43 women);hypertension;Other (see comment), Risk factor comments: osteoporosis     Objective:    Today's Vitals   05/29/23 1518  Weight: 153 lb (69.4 kg)  Height: 5\' 5"  (1.651 m)   Body mass index is 25.46 kg/m.     05/29/2023    3:24 PM 05/11/2022    9:19 AM  Advanced Directives  Does Patient Have a Medical Advance Directive? Yes Yes  Type of Estate agent of [place];Living will Living will;Healthcare Power of Attorney  Does patient want to make changes to medical advance directive?  No - Patient declined  Copy of Healthcare Power of Attorney in Chart? No - copy requested No - copy requested    Current Medications (verified) Outpatient Encounter Medications as of 05/29/2023  Medication Sig   alendronate (FOSAMAX) 70 MG tablet TAKE 1 TABLET BY MOUTH EVERY 7 DAYS. TAKE WITH A FULL GLASS OF WATER ON AN EMPTY STOMACH.   ALPRAZolam (XANAX) 0.25 MG tablet TAKE 1 TABLET BY MOUTH AT BEDTIME  AS NEEDED FOR ANXIETY   aspirin 81 MG tablet Take 81 mg by mouth daily.   azelastine (ASTELIN) 0.1 % nasal spray Use in each nostril as directed   cholecalciferol (VITAMIN D) 1000 UNITS tablet Take 1,000 Units by mouth daily.   escitalopram (LEXAPRO) 20 MG tablet TAKE 1 TABLET BY MOUTH EVERY DAY   levocetirizine (XYZAL) 5 MG tablet TAKE 1 TABLET (5 MG TOTAL) BY MOUTH EVERY EVENING. ANNUAL APPT DUE IN OCT MUST SEE PROVIDER FOR FUTURE REFILLS   montelukast (SINGULAIR) 10 MG tablet Take 1 tablet (10 mg total) by mouth at bedtime.   Multiple Vitamins-Minerals (MULTIVITAMIN PO) Take by mouth.   valACYclovir (VALTREX) 500 MG tablet Take 1 tablet (500 mg total) by mouth as needed.   valsartan (DIOVAN) 40 MG tablet Take 1 tablet (40 mg total) by mouth daily.   mirabegron ER (MYRBETRIQ) 50 MG TB24 tablet Take 1 tablet (50 mg total) by mouth daily. One po qd   No facility-administered encounter medications on file as of 05/29/2023.    Allergies (verified) Patient has no known allergies.   History: Past Medical History:  Diagnosis Date   Anxiety    Breast cancer (HCC)    s/p double mastectomy 01/2010   Depression    Hypertension    Osteoporosis    STD (sexually transmitted disease)    Herpes   Past Surgical History:  Procedure Laterality Date   basel removal Right 05/09/2023   Rt Leg   COLPOSCOPY     cone procedure  09/25/2008  unsure about date   double mastectomy     KNEE SURGERY     MASTECTOMY  01/25/2010   Bilateral    Family History  Problem Relation Age of Onset   Ulcers Father    Alcohol abuse Father    Heart failure Mother    Alcohol abuse Mother    Heart disease Mother    Depression Sister    Depression Daughter    Cancer Paternal Grandmother    Social History   Socioeconomic History   Marital status: Married    Spouse name: Brett Canales   Number of children: 1   Years of education: Not on file   Highest education level: Associate degree: academic program   Occupational History   Occupation: Multimedia programmer   Occupation: Retired  Tobacco Use   Smoking status: Former    Current packs/day: 0.00    Types: Cigarettes    Start date: 12/06/1997    Quit date: 12/06/2012    Years since quitting: 10.4   Smokeless tobacco: Never   Tobacco comments:    quit over a year ago  Vaping Use   Vaping status: Never Used  Substance and Sexual Activity   Alcohol use: Yes    Alcohol/week: 5.0 standard drinks of alcohol    Types: 5 Glasses of wine per week   Drug use: No   Sexual activity: Yes    Partners: Male    Birth control/protection: Post-menopausal  Other Topics Concern   Not on file  Social History Narrative   Not on file   Social Determinants of Health   Financial Resource Strain: Low Risk  (05/25/2023)   Overall Financial Resource Strain (CARDIA)    Difficulty of Paying Living Expenses: Not hard at all  Food Insecurity: No Food Insecurity (05/25/2023)   Hunger Vital Sign    Worried About Running Out of Food in the Last Year: Never true    Ran Out of Food in the Last Year: Never true  Transportation Needs: No Transportation Needs (05/25/2023)   PRAPARE - Administrator, Civil Service (Medical): No    Lack of Transportation (Non-Medical): No  Physical Activity: Sufficiently Active (03/02/2023)   Exercise Vital Sign    Days of Exercise per Week: 4 days    Minutes of Exercise per Session: 40 min  Stress: No Stress Concern Present (03/02/2023)   [employer] of Occupational Health - Occupational Stress Questionnaire    Feeling of Stress : Only a little  Social Connections: Unknown (03/02/2023)   Social Connection and Isolation Panel [NHANES]    Frequency of Communication with Friends and Family: More than three times a week    Frequency of Social Gatherings with Friends and Family: Twice a week    Attends Religious Services: Patient declined    Database administrator or Organizations: Not on file    Attends Museum/gallery exhibitions officer: Not on file    Marital Status: Married    Tobacco Counseling Counseling given: Not Answered Tobacco comments: quit over a year ago   Clinical Intake:  Pre-visit preparation completed: Yes  Pain : No/denies pain     BMI - recorded: 25.46 Nutritional Status: BMI 25 -29 Overweight Nutritional Risks: None Diabetes: No  How often do you need to have someone help you when you read instructions, pamphlets, or other written materials from your doctor or pharmacy?: 1 - Never  Interpreter Needed?: No  Information entered by :: Sigourney Portillo, RMA   Activities  of Daily Living    05/25/2023    8:05 PM  In your present state of health, do you have any difficulty performing the following activities:  Hearing? 0  Vision? 0  Difficulty concentrating or making decisions? 0  Walking or climbing stairs? 0  Dressing or bathing? 0  Doing errands, shopping? 0  Preparing Food and eating ? N  Using the Toilet? N  In the past six months, have you accidently leaked urine? Y  Do you have problems with loss of bowel control? Y  Managing your Medications? N  Managing your Finances? N  Housekeeping or managing your Housekeeping? N    Patient Care Team: Pincus Sanes, MD as PCP - General (Internal Medicine)  Indicate any recent Medical Services you may have received from other than [facility] providers in the past year (date may be approximate).     Assessment:   This is a routine wellness examination for Angelica Patton.  Hearing/Vision screen Hearing Screening - Comments:: Denies hearing difficulties   Vision Screening - Comments:: Wears eyeglasses  Dietary issues and exercise activities discussed:     Goals Addressed             This Visit's Progress    To stay healthy.   On track     Depression Screen    05/29/2023    3:26 PM 03/12/2023    1:50 PM 03/05/2023    1:20 PM 01/18/2023    2:07 PM 07/11/2022   11:30 AM 05/11/2022    9:22 AM 06/29/2021   12:09 PM   PHQ 2/9 Scores  PHQ - 2 Score 0 0 0 0 0 0 2  PHQ- 9 Score 0      5    Fall Risk    05/25/2023    8:05 PM 03/12/2023    1:50 PM 07/11/2022   11:29 AM 05/11/2022    9:30 AM 06/29/2021   11:10 AM  Fall Risk   Falls in the past year? 0 0 0 0 0  Number falls in past yr: 0 0 0 0 0  Injury with Fall? 0 0 0 0 0  Risk for fall due to :  No Fall Risks No Fall Risks No Fall Risks No Fall Risks  Follow up Falls evaluation completed;Falls prevention discussed Falls evaluation completed Falls evaluation completed Falls evaluation completed Falls evaluation completed    MEDICARE RISK AT HOME: Medicare Risk at Home Any stairs in or around the home?: Yes If so, are there any without handrails?: No Home free of loose throw rugs in walkways, pet beds, electrical cords, etc?: No Adequate lighting in your home to reduce risk of falls?: Yes Life alert?: No Use of a cane, walker or w/c?: No Grab bars in the bathroom?: Yes Shower chair or bench in shower?: Yes Elevated toilet seat or a handicapped toilet?: No  TIMED UP AND GO:  Was the test performed?  No    Cognitive Function:        05/29/2023    3:25 PM 05/11/2022    9:31 AM  6CIT Screen  What Year? 0 points 0 points  What month? 0 points 0 points  What time? 0 points 0 points  Count back from 20 0 points 0 points  Months in reverse 0 points 0 points  Repeat phrase 0 points 0 points  Total Score 0 points 0 points    Immunizations Immunization History  Administered Date(s) Administered   Fluad Quad(high Dose 65+) 06/14/2020, 06/29/2021,  07/11/2022   Influenza Inj Mdck Quad Pf 06/23/2019   Influenza Split 07/17/2011, 07/15/2012   Influenza, High Dose Seasonal PF 05/30/2018   Influenza,inj,Quad PF,6+ Mos 07/03/2016, 09/07/2017   Moderna Covid-19 Vaccine Bivalent Booster 24yrs & up 07/13/2021   PFIZER(Purple Top)SARS-COV-2 Vaccination 11/24/2019, 12/16/2019, 07/08/2020   Pneumococcal Conjugate-13 01/21/2018   Pneumococcal  Polysaccharide-23 07/17/2011, 03/04/2019   Zoster Recombinant(Shingrix) 11/13/2017, 01/16/2018    TDAP status: Due, Education has been provided regarding the importance of this vaccine. Advised may receive this vaccine at local pharmacy or Health Dept. Aware to provide a copy of the vaccination record if obtained from local pharmacy or Health Dept. Verbalized acceptance and understanding.  Flu Vaccine status: Due, Education has been provided regarding the importance of this vaccine. Advised may receive this vaccine at local pharmacy or Health Dept. Aware to provide a copy of the vaccination record if obtained from local pharmacy or Health Dept. Verbalized acceptance and understanding.  Pneumococcal vaccine status: Up to date  Covid-19 vaccine status: Information provided on how to obtain vaccines.   Qualifies for Shingles Vaccine? Yes   Zostavax completed Yes   Shingrix Completed?: Yes  Screening Tests Health Maintenance  Topic Date Due   DTaP/Tdap/Td (1 - Tdap) Never done   INFLUENZA VACCINE  04/26/2023   COVID-19 Vaccine (5 - 2023-24 season) 05/27/2023   DEXA SCAN  06/30/2023   Medicare Annual Wellness (AWV)  05/28/2024   Colonoscopy  06/15/2024   Pneumonia Vaccine 19+ Years old  Completed   Hepatitis C Screening  Completed   Zoster Vaccines- Shingrix  Completed   HPV VACCINES  Aged Out    Health Maintenance  Health Maintenance Due  Topic Date Due   DTaP/Tdap/Td (1 - Tdap) Never done   INFLUENZA VACCINE  04/26/2023   COVID-19 Vaccine (5 - 2023-24 season) 05/27/2023    Colorectal cancer screening: Type of screening: Colonoscopy. Completed 06/15/2014. Repeat every 10 years   Bone Density status: Completed 06/26/2021. Results reflect: Bone density results: OSTEOPOROSIS. Repeat every 2 years.  Lung Cancer Screening: (Low Dose CT Chest recommended if Age 50-80 years, 20 pack-year currently smoking OR have quit w/in 15years.) does not qualify.   Lung Cancer Screening  Referral: N/A  Additional Screening:  Hepatitis C Screening: does qualify; Completed 03/12/2023  Vision Screening: Recommended annual ophthalmology exams for early detection of glaucoma and other disorders of the eye. Is the patient up to date with their annual eye exam?  Yes  Who is the provider or what is the name of the office in which the patient attends annual eye exams? Dr. Hyman Hopes If pt is not established with a provider, would they like to be referred to a provider to establish care? No .   Dental Screening: Recommended annual dental exams for proper oral hygiene   Community Resource Referral / Chronic Care Management: CRR required this visit?  No   CCM required this visit?  No     Plan:     I have personally reviewed and noted the following in the patient's chart:   Medical and social history Use of alcohol, tobacco or illicit drugs  Current medications and supplements including opioid prescriptions. Patient is not currently taking opioid prescriptions. Functional ability and status Nutritional status Physical activity Advanced directives List of other physicians Hospitalizations, surgeries, and ER visits in previous 12 months Vitals Screenings to include cognitive, depression, and falls Referrals and appointments  In addition, I have reviewed and discussed with patient certain preventive protocols, quality metrics,  and best practice recommendations. A written personalized care plan for preventive services as well as general preventive health recommendations were provided to patient.     Annemarie Sebree L Kirby Cortese, CMA   05/29/2023   After Visit Summary: (MyChart) Due to this being a telephonic visit, the after visit summary with patients personalized plan was offered to patient via MyChart   Nurse Notes: Patient is due for a Tdap vaccine and would like to get this during her up coming visit with Dr. Lawerance Bach.  She is also due for a DEXA and aware to call [facility] and schedule an  appointment for that.  Patient is up to date on her health maintenance and has no other concerns to address today.

## 2023-05-29 NOTE — Patient Instructions (Signed)
Angelica Patton , Thank you for taking time to come for your Medicare Wellness Visit. I appreciate your ongoing commitment to your health goals. Please review the following plan we discussed and let me know if I can assist you in the future.   Referrals/Orders/Follow-Ups/Clinician Recommendations: You are due for a Tetanus vaccine, which you can get in the office or at your local pharmacy.  Also, remember to call [facility] to schedule a Bone Density screening for this year.    This is a list of the screening recommended for you and due dates:  Health Maintenance  Topic Date Due   DTaP/Tdap/Td vaccine (1 - Tdap) Never done   Flu Shot  04/26/2023   COVID-19 Vaccine (5 - 2023-24 season) 05/27/2023   DEXA scan (bone density measurement)  06/30/2023   Medicare Annual Wellness Visit  05/28/2024   Colon Cancer Screening  06/15/2024   Pneumonia Vaccine  Completed   Hepatitis C Screening  Completed   Zoster (Shingles) Vaccine  Completed   HPV Vaccine  Aged Out    Advanced directives: (Copy Requested) Please bring a copy of your health care power of attorney and living will to the office to be added to your chart at your convenience.  Next Medicare Annual Wellness Visit scheduled for next year: Yes

## 2023-05-30 ENCOUNTER — Encounter: Payer: Self-pay | Admitting: Internal Medicine

## 2023-06-04 ENCOUNTER — Other Ambulatory Visit: Payer: Self-pay | Admitting: Internal Medicine

## 2023-06-04 DIAGNOSIS — Z08 Encounter for follow-up examination after completed treatment for malignant neoplasm: Secondary | ICD-10-CM | POA: Diagnosis not present

## 2023-06-04 DIAGNOSIS — I788 Other diseases of capillaries: Secondary | ICD-10-CM | POA: Diagnosis not present

## 2023-06-04 DIAGNOSIS — L821 Other seborrheic keratosis: Secondary | ICD-10-CM | POA: Diagnosis not present

## 2023-06-04 DIAGNOSIS — L814 Other melanin hyperpigmentation: Secondary | ICD-10-CM | POA: Diagnosis not present

## 2023-06-04 DIAGNOSIS — Z85828 Personal history of other malignant neoplasm of skin: Secondary | ICD-10-CM | POA: Diagnosis not present

## 2023-06-04 DIAGNOSIS — D225 Melanocytic nevi of trunk: Secondary | ICD-10-CM | POA: Diagnosis not present

## 2023-06-10 ENCOUNTER — Encounter (INDEPENDENT_AMBULATORY_CARE_PROVIDER_SITE_OTHER): Payer: Medicare HMO | Admitting: Internal Medicine

## 2023-06-10 DIAGNOSIS — I10 Essential (primary) hypertension: Secondary | ICD-10-CM

## 2023-06-11 DIAGNOSIS — I10 Essential (primary) hypertension: Secondary | ICD-10-CM | POA: Diagnosis not present

## 2023-06-11 NOTE — Telephone Encounter (Signed)
Please see the MyChart message reply(ies) for my assessment and plan.   The patient gave consent for this Medical Advice Message and is aware that it may result in a bill to their insurance company as well as the possibility that this may result in a co-payment or deductible. They are an established patient, but are not seeking medical advice exclusively about a problem treated during an in person or video visit in the last 7 days. I did not recommend an in person or video visit within 7 days of my reply.   I spent a total of 5 minutes cumulative time within 7 days through [employer]  Pincus Sanes, MD

## 2023-07-03 DIAGNOSIS — H43813 Vitreous degeneration, bilateral: Secondary | ICD-10-CM | POA: Diagnosis not present

## 2023-07-03 DIAGNOSIS — H5213 Myopia, bilateral: Secondary | ICD-10-CM | POA: Diagnosis not present

## 2023-07-13 ENCOUNTER — Encounter: Payer: Self-pay | Admitting: Internal Medicine

## 2023-07-13 ENCOUNTER — Encounter: Payer: Medicare HMO | Admitting: Internal Medicine

## 2023-07-15 ENCOUNTER — Other Ambulatory Visit (HOSPITAL_COMMUNITY): Payer: Self-pay

## 2023-07-15 MED ORDER — HYDROCHLOROTHIAZIDE 12.5 MG PO TABS
12.5000 mg | ORAL_TABLET | Freq: Every day | ORAL | 3 refills | Status: DC
  Filled 2023-07-15: qty 30, 30d supply, fill #0

## 2023-07-16 ENCOUNTER — Other Ambulatory Visit: Payer: Self-pay

## 2023-07-16 ENCOUNTER — Other Ambulatory Visit (HOSPITAL_COMMUNITY): Payer: Self-pay

## 2023-07-16 MED ORDER — HYDROCHLOROTHIAZIDE 12.5 MG PO TABS: 12.5000 mg | ORAL_TABLET | Freq: Every day | ORAL | 3 refills | Status: DC

## 2023-07-24 ENCOUNTER — Encounter: Payer: Self-pay | Admitting: Internal Medicine

## 2023-07-24 DIAGNOSIS — I2510 Atherosclerotic heart disease of native coronary artery without angina pectoris: Secondary | ICD-10-CM | POA: Insufficient documentation

## 2023-07-24 NOTE — Progress Notes (Unsigned)
Subjective:    Patient ID: Angelica Patton, female    DOB: 03-07-1951, 72 y.o.   MRN: [id number]      HPI Angelica Patton is here for a Physical exam and her chronic medical problems.   BP at home has been variable-121/81, 105/67, 136/88, 142/82, 142/90, 152/88.  We did adjust some of her medications via MyChart.  Sleeps a lot.   Dog just died.  She was on semaglutide for a while and did lose 10 pounds-she had significant constipation and had to stop it.  Energy level was very low on it and still has not completely recovered  Medications and allergies reviewed with patient and updated if appropriate.  Current Outpatient Medications on File Prior to Visit  Medication Sig Dispense Refill   alendronate (FOSAMAX) 70 MG tablet TAKE 1 TABLET BY MOUTH EVERY 7 DAYS. TAKE WITH A FULL GLASS OF WATER ON AN EMPTY STOMACH. 12 tablet 3   ALPRAZolam (XANAX) 0.25 MG tablet TAKE 1 TABLET BY MOUTH AT BEDTIME AS NEEDED FOR ANXIETY 30 tablet 0   aspirin 81 MG tablet Take 81 mg by mouth daily.     Azelastine HCl 137 MCG/SPRAY SOLN USE IN EACH NOSTRIL AS DIRECTED 30 mL 1   cholecalciferol (VITAMIN D) 1000 UNITS tablet Take 1,000 Units by mouth daily.     levocetirizine (XYZAL) 5 MG tablet TAKE 1 TABLET (5 MG TOTAL) BY MOUTH EVERY EVENING. ANNUAL APPT DUE IN OCT MUST SEE PROVIDER FOR FUTURE REFILLS 90 tablet 2   montelukast (SINGULAIR) 10 MG tablet Take 1 tablet (10 mg total) by mouth at bedtime. 90 tablet 1   Multiple Vitamins-Minerals (MULTIVITAMIN PO) Take by mouth.     tretinoin (RETIN-A) 0.1 % cream SMARTSIG:Sparingly Topical Every Night     valACYclovir (VALTREX) 500 MG tablet Take 1 tablet (500 mg total) by mouth as needed. 30 tablet 3   No current facility-administered medications on file prior to visit.    Review of Systems  Constitutional:  Negative for fever.  Eyes:  Negative for visual disturbance.  Respiratory:  Negative for cough, shortness of breath and wheezing.   Cardiovascular:  Negative  for chest pain, palpitations and leg swelling.  Gastrointestinal:  Negative for abdominal pain, blood in stool, constipation and diarrhea.       No gerd  Genitourinary:  Negative for dysuria.  Musculoskeletal:  Negative for arthralgias and back pain.  Skin:  Negative for rash.  Neurological:  Positive for headaches (occ). Negative for light-headedness.  Psychiatric/Behavioral:  Negative for dysphoric mood and sleep disturbance. The patient is not nervous/anxious.        Objective:   Vitals:   07/25/23 1048  BP: 134/82  Pulse: 75  Temp: 98 F (36.7 C)  SpO2: 97%   Filed Weights   07/25/23 1048  Weight: 150 lb (68 kg)   Body mass index is 24.96 kg/m.  BP Readings from Last 3 Encounters:  07/25/23 134/82  03/12/23 138/78  03/05/23 (!) 147/80    Wt Readings from Last 3 Encounters:  07/25/23 150 lb (68 kg)  05/29/23 153 lb (69.4 kg)  03/12/23 157 lb (71.2 kg)       Physical Exam Constitutional: She appears well-developed and well-nourished. No distress.  HENT:  Head: Normocephalic and atraumatic.  Right Ear: External ear normal. Normal ear canal and TM Left Ear: External ear normal.  Normal ear canal and TM Mouth/Throat: Oropharynx is clear and moist.  Eyes: Conjunctivae normal.  Neck: Neck supple. No  tracheal deviation present. No thyromegaly present.  No carotid bruit  Cardiovascular: Normal rate, regular rhythm and normal heart sounds.   No murmur heard.  No edema. Pulmonary/Chest: Effort normal and breath sounds normal. No respiratory distress. She has no wheezes. She has no rales.  Breast: deferred   Abdominal: Soft. She exhibits no distension. There is no tenderness.  Lymphadenopathy: She has no cervical adenopathy.  Skin: Skin is warm and dry. She is not diaphoretic.  Psychiatric: She has a normal mood and affect. Her behavior is normal.     Lab Results  Component Value Date   WBC 8.2 07/11/2022   HGB 13.4 07/11/2022   HCT 40.5 07/11/2022   PLT  232.0 07/11/2022   GLUCOSE 83 07/11/2022   CHOL 201 (H) 07/11/2022   TRIG 40.0 07/11/2022   HDL 102.40 07/11/2022   LDLCALC 90 07/11/2022   ALT 19 07/11/2022   AST 20 07/11/2022   NA 139 07/11/2022   K 4.0 07/11/2022   CL 103 07/11/2022   CREATININE 0.69 07/11/2022   BUN 14 07/11/2022   CO2 29 07/11/2022   TSH 2.17 07/11/2022   HGBA1C 5.6 07/11/2022         Assessment & Plan:   Physical exam: Screening blood work  ordered Exercise  regular Weight  is good  Substance abuse  none   Reviewed recommended immunizations.  Flu vaccine today   Health Maintenance  Topic Date Due   INFLUENZA VACCINE  04/26/2023   DEXA SCAN  06/30/2023   COVID-19 Vaccine (5 - 2023-24 season) 08/10/2023 (Originally 05/27/2023)   DTaP/Tdap/Td (1 - Tdap) 07/24/2024 (Originally 12/04/1969)   Medicare Annual Wellness (AWV)  05/28/2024   Colonoscopy  06/15/2024   Pneumonia Vaccine 36+ Years old  Completed   Hepatitis C Screening  Completed   Zoster Vaccines- Shingrix  Completed   HPV VACCINES  Aged Out          See Problem List for Assessment and Plan of chronic medical problems.

## 2023-07-24 NOTE — Assessment & Plan Note (Signed)
CAC score 55   (2023) Chronic-mild Lab Results  Component Value Date   LDLCALC 90 07/11/2022   Not currently on a statin Continue aspirin 81 mg daily Consider another CT CAC score in 2028 to see if there is any progression Healthy diet, regular exercise

## 2023-07-24 NOTE — Patient Instructions (Addendum)
Call [facility] to schedule your bone density scan - Phone: [phone]   Blood work was ordered.   The lab is on the first floor.    Medications changes include :   increase hydrochlorothiazide to 25 mg daily     Return in about 1 year (around 07/24/2024) for Physical Exam.   Health Maintenance, Female Adopting a healthy lifestyle and getting preventive care are important in promoting health and wellness. Ask your health care provider about: The right schedule for you to have regular tests and exams. Things you can do on your own to prevent diseases and keep yourself healthy. What should I know about diet, weight, and exercise? Eat a healthy diet  Eat a diet that includes plenty of vegetables, fruits, low-fat dairy products, and lean protein. Do not eat a lot of foods that are high in solid fats, added sugars, or sodium. Maintain a healthy weight Body mass index (BMI) is used to identify weight problems. It estimates body fat based on height and weight. Your health care provider can help determine your BMI and help you achieve or maintain a healthy weight. Get regular exercise Get regular exercise. This is one of the most important things you can do for your health. Most adults should: Exercise for at least 150 minutes each week. The exercise should increase your heart rate and make you sweat (moderate-intensity exercise). Do strengthening exercises at least twice a week. This is in addition to the moderate-intensity exercise. Spend less time sitting. Even light physical activity can be beneficial. Watch cholesterol and blood lipids Have your blood tested for lipids and cholesterol at 71 years of age, then have this test every 5 years. Have your cholesterol levels checked more often if: Your lipid or cholesterol levels are high. You are older than 70 years of age. You are at high risk for heart disease. What should I know about cancer screening? Depending on your health  history and family history, you may need to have cancer screening at various ages. This may include screening for: Breast cancer. Cervical cancer. Colorectal cancer. Skin cancer. Lung cancer. What should I know about heart disease, diabetes, and high blood pressure? Blood pressure and heart disease High blood pressure causes heart disease and increases the risk of stroke. This is more likely to develop in people who have high blood pressure readings or are overweight. Have your blood pressure checked: Every 3-5 years if you are 67-44 years of age. Every year if you are 87 years old or older. Diabetes Have regular diabetes screenings. This checks your fasting blood sugar level. Have the screening done: Once every three years after age 21 if you are at a normal weight and have a low risk for diabetes. More often and at a younger age if you are overweight or have a high risk for diabetes. What should I know about preventing infection? Hepatitis B If you have a higher risk for hepatitis B, you should be screened for this virus. Talk with your health care provider to find out if you are at risk for hepatitis B infection. Hepatitis C Testing is recommended for: Everyone born from [birth year] through 1963. Anyone with known risk factors for hepatitis C. Sexually transmitted infections (STIs) Get screened for STIs, including gonorrhea and chlamydia, if: You are sexually active and are younger than 70 years of age. You are older than 70 years of age and your health care provider tells you that you are at risk for this type  of infection. Your sexual activity has changed since you were last screened, and you are at increased risk for chlamydia or gonorrhea. Ask your health care provider if you are at risk. Ask your health care provider about whether you are at high risk for HIV. Your health care provider may recommend a prescription medicine to help prevent HIV infection. If you choose to take medicine to  prevent HIV, you should first get tested for HIV. You should then be tested every 3 months for as long as you are taking the medicine. Pregnancy If you are about to stop having your period (premenopausal) and you may become pregnant, seek counseling before you get pregnant. Take 400 to 800 micrograms (mcg) of folic acid every day if you become pregnant. Ask for birth control (contraception) if you want to prevent pregnancy. Osteoporosis and menopause Osteoporosis is a disease in which the bones lose minerals and strength with aging. This can result in bone fractures. If you are 67 years old or older, or if you are at risk for osteoporosis and fractures, ask your health care provider if you should: Be screened for bone loss. Take a calcium or vitamin D supplement to lower your risk of fractures. Be given hormone replacement therapy (HRT) to treat symptoms of menopause. Follow these instructions at home: Alcohol use Do not drink alcohol if: Your health care provider tells you not to drink. You are pregnant, may be pregnant, or are planning to become pregnant. If you drink alcohol: Limit how much you have to: 0-1 drink a day. Know how much alcohol is in your drink. In the [place] one drink equals one 12 oz bottle of beer (355 mL), one 5 oz glass of wine (148 mL), or one 1 oz glass of hard liquor (44 mL). Lifestyle Do not use any products that contain nicotine or tobacco. These products include cigarettes, chewing tobacco, and vaping devices, such as e-cigarettes. If you need help quitting, ask your health care provider. Do not use street drugs. Do not share needles. Ask your health care provider for help if you need support or information about quitting drugs. General instructions Schedule regular health, dental, and eye exams. Stay current with your vaccines. Tell your health care provider if: You often feel depressed. You have ever been abused or do not feel safe at  home. Summary Adopting a healthy lifestyle and getting preventive care are important in promoting health and wellness. Follow your health care provider's instructions about healthy diet, exercising, and getting tested or screened for diseases. Follow your health care provider's instructions on monitoring your cholesterol and blood pressure. This information is not intended to replace advice given to you by your health care provider. Make sure you discuss any questions you have with your health care provider. Document Revised: 01/31/2021 Document Reviewed: 01/31/2021 Elsevier Patient Education  2024 [employer].

## 2023-07-25 ENCOUNTER — Ambulatory Visit: Payer: Medicare HMO | Admitting: Internal Medicine

## 2023-07-25 VITALS — BP 134/82 | HR 75 | Temp 98.0°F | Ht 65.0 in | Wt 150.0 lb

## 2023-07-25 DIAGNOSIS — J309 Allergic rhinitis, unspecified: Secondary | ICD-10-CM

## 2023-07-25 DIAGNOSIS — I10 Essential (primary) hypertension: Secondary | ICD-10-CM | POA: Diagnosis not present

## 2023-07-25 DIAGNOSIS — Z85828 Personal history of other malignant neoplasm of skin: Secondary | ICD-10-CM | POA: Diagnosis not present

## 2023-07-25 DIAGNOSIS — Z0000 Encounter for general adult medical examination without abnormal findings: Secondary | ICD-10-CM | POA: Diagnosis not present

## 2023-07-25 DIAGNOSIS — I2510 Atherosclerotic heart disease of native coronary artery without angina pectoris: Secondary | ICD-10-CM | POA: Diagnosis not present

## 2023-07-25 DIAGNOSIS — F3289 Other specified depressive episodes: Secondary | ICD-10-CM

## 2023-07-25 DIAGNOSIS — R7303 Prediabetes: Secondary | ICD-10-CM

## 2023-07-25 DIAGNOSIS — F419 Anxiety disorder, unspecified: Secondary | ICD-10-CM | POA: Diagnosis not present

## 2023-07-25 DIAGNOSIS — M810 Age-related osteoporosis without current pathological fracture: Secondary | ICD-10-CM | POA: Diagnosis not present

## 2023-07-25 DIAGNOSIS — Z23 Encounter for immunization: Secondary | ICD-10-CM | POA: Diagnosis not present

## 2023-07-25 LAB — COMPREHENSIVE METABOLIC PANEL
ALT: 14 U/L (ref 0–35)
AST: 16 U/L (ref 0–37)
Albumin: 4.5 g/dL (ref 3.5–5.2)
Alkaline Phosphatase: 37 U/L — ABNORMAL LOW (ref 39–117)
BUN: 14 mg/dL (ref 6–23)
CO2: 29 meq/L (ref 19–32)
Calcium: 9.8 mg/dL (ref 8.4–10.5)
Chloride: 102 meq/L (ref 96–112)
Creatinine, Ser: 0.77 mg/dL (ref 0.40–1.20)
GFR: 78.04 mL/min (ref >60.00)
Glucose, Bld: 97 mg/dL (ref 70–99)
Potassium: 4.7 meq/L (ref 3.5–5.1)
Sodium: 139 meq/L (ref 135–145)
Total Bilirubin: 0.5 mg/dL (ref 0.2–1.2)
Total Protein: 7 g/dL (ref 6.0–8.3)

## 2023-07-25 LAB — CBC WITH DIFFERENTIAL/PLATELET
Basophils Absolute: 0 10*3/uL (ref 0.0–0.1)
Basophils Relative: 0.5 % (ref 0.0–3.0)
Eosinophils Absolute: 0.1 10*3/uL (ref 0.0–0.7)
Eosinophils Relative: 0.7 % (ref 0.0–5.0)
HCT: 40.5 % (ref 36.0–46.0)
Hemoglobin: 12.9 g/dL (ref 12.0–15.0)
Lymphocytes Relative: 33.7 % (ref 12.0–46.0)
Lymphs Abs: 2.7 10*3/uL (ref 0.7–4.0)
MCHC: 31.8 g/dL (ref 30.0–36.0)
MCV: 93.3 fL (ref 78.0–100.0)
Monocytes Absolute: 0.6 10*3/uL (ref 0.1–1.0)
Monocytes Relative: 7.8 % (ref 3.0–12.0)
Neutro Abs: 4.6 10*3/uL (ref 1.4–7.7)
Neutrophils Relative %: 57.3 % (ref 43.0–77.0)
Platelets: 323 10*3/uL (ref 150.0–400.0)
RBC: 4.33 Mil/uL (ref 3.87–5.11)
RDW: 12.6 % (ref 11.5–15.5)
WBC: 8.1 10*3/uL (ref 4.0–10.5)

## 2023-07-25 LAB — LIPID PANEL
Cholesterol: 201 mg/dL — ABNORMAL HIGH (ref 0–200)
HDL: 87.5 mg/dL (ref >39.00)
LDL Cholesterol: 102 mg/dL — ABNORMAL HIGH (ref 0–99)
NonHDL: 113.09
Total CHOL/HDL Ratio: 2
Triglycerides: 57 mg/dL (ref 0.0–149.0)
VLDL: 11.4 mg/dL (ref 0.0–40.0)

## 2023-07-25 LAB — TSH: TSH: 3.6 u[IU]/mL (ref 0.35–5.50)

## 2023-07-25 LAB — HEMOGLOBIN A1C: Hgb A1c MFr Bld: 5.5 % (ref 4.6–6.5)

## 2023-07-25 LAB — VITAMIN D 25 HYDROXY (VIT D DEFICIENCY, FRACTURES): VITD: 63.92 ng/mL (ref 30.00–100.00)

## 2023-07-25 MED ORDER — HYDROCHLOROTHIAZIDE 25 MG PO TABS: 25.0000 mg | ORAL_TABLET | Freq: Every day | ORAL | 3 refills | Status: DC

## 2023-07-25 MED ORDER — VALSARTAN 80 MG PO TABS: 80.0000 mg | ORAL_TABLET | Freq: Every day | ORAL | 3 refills | Status: DC

## 2023-07-25 MED ORDER — ESCITALOPRAM OXALATE 20 MG PO TABS: ORAL_TABLET | ORAL | 3 refills | Status: AC

## 2023-07-25 NOTE — Assessment & Plan Note (Addendum)
Chronic Blood pressure not ideally controlled CMP, CBC Continue valsartan 80 mg daily, increase hydrochlorothiazide to 25 mg Monitor BP at home

## 2023-07-25 NOTE — Assessment & Plan Note (Signed)
Chronic Controlled, Stable Check TSH Continue Lexapro 20 mg daily

## 2023-07-25 NOTE — Assessment & Plan Note (Signed)
Chronic DEXA due-ordered Continue Fosamax 70 mg weekly-started 06/2021-plan to continue for 5 years Currently taking D3 1000 units a day Taking calcium daily Continue regular exercise Check vitamin D level, CBC, CMP, TSH

## 2023-07-25 NOTE — Addendum Note (Signed)
Addended by: Karma Ganja on: 07/25/2023 04:49 PM   Modules accepted: Orders

## 2023-07-25 NOTE — Assessment & Plan Note (Signed)
Chronic Controlled, Stable Check TSH Continue Lexapro 20 mg daily, alprazolam 0.25 mg nightly as needed

## 2023-07-25 NOTE — Assessment & Plan Note (Signed)
Chronic Lab Results  Component Value Date   HGBA1C 5.6 07/11/2022   Check a1c Low sugar / carb diet Stressed regular exercise

## 2023-07-25 NOTE — Assessment & Plan Note (Signed)
Chronic Continue xyzal 5 mg and singulair nightly as needed

## 2023-09-01 ENCOUNTER — Other Ambulatory Visit: Payer: Self-pay | Admitting: Internal Medicine

## 2023-09-07 DIAGNOSIS — M810 Age-related osteoporosis without current pathological fracture: Secondary | ICD-10-CM | POA: Diagnosis not present

## 2023-09-07 LAB — HM DEXA SCAN

## 2023-09-17 ENCOUNTER — Other Ambulatory Visit: Payer: Self-pay | Admitting: Internal Medicine

## 2023-12-11 ENCOUNTER — Other Ambulatory Visit: Payer: Self-pay | Admitting: Internal Medicine

## 2023-12-19 ENCOUNTER — Encounter: Payer: Self-pay | Admitting: Internal Medicine

## 2024-01-28 DIAGNOSIS — Z85828 Personal history of other malignant neoplasm of skin: Secondary | ICD-10-CM | POA: Diagnosis not present

## 2024-01-28 DIAGNOSIS — Z08 Encounter for follow-up examination after completed treatment for malignant neoplasm: Secondary | ICD-10-CM | POA: Diagnosis not present

## 2024-01-28 DIAGNOSIS — D225 Melanocytic nevi of trunk: Secondary | ICD-10-CM | POA: Diagnosis not present

## 2024-01-28 DIAGNOSIS — L821 Other seborrheic keratosis: Secondary | ICD-10-CM | POA: Diagnosis not present

## 2024-01-28 DIAGNOSIS — I781 Nevus, non-neoplastic: Secondary | ICD-10-CM | POA: Diagnosis not present

## 2024-01-28 DIAGNOSIS — L814 Other melanin hyperpigmentation: Secondary | ICD-10-CM | POA: Diagnosis not present

## 2024-01-28 DIAGNOSIS — L578 Other skin changes due to chronic exposure to nonionizing radiation: Secondary | ICD-10-CM | POA: Diagnosis not present

## 2024-02-12 DIAGNOSIS — Z1211 Encounter for screening for malignant neoplasm of colon: Secondary | ICD-10-CM | POA: Diagnosis not present

## 2024-02-12 DIAGNOSIS — F321 Major depressive disorder, single episode, moderate: Secondary | ICD-10-CM | POA: Diagnosis not present

## 2024-02-12 DIAGNOSIS — M810 Age-related osteoporosis without current pathological fracture: Secondary | ICD-10-CM | POA: Diagnosis not present

## 2024-02-12 DIAGNOSIS — F411 Generalized anxiety disorder: Secondary | ICD-10-CM | POA: Diagnosis not present

## 2024-02-12 DIAGNOSIS — I2510 Atherosclerotic heart disease of native coronary artery without angina pectoris: Secondary | ICD-10-CM | POA: Diagnosis not present

## 2024-02-12 DIAGNOSIS — I10 Essential (primary) hypertension: Secondary | ICD-10-CM | POA: Diagnosis not present

## 2024-02-12 DIAGNOSIS — Z1330 Encounter for screening examination for mental health and behavioral disorders, unspecified: Secondary | ICD-10-CM | POA: Diagnosis not present

## 2024-02-12 DIAGNOSIS — Z853 Personal history of malignant neoplasm of breast: Secondary | ICD-10-CM | POA: Diagnosis not present

## 2024-03-04 ENCOUNTER — Other Ambulatory Visit: Payer: Self-pay | Admitting: Internal Medicine

## 2024-04-09 ENCOUNTER — Other Ambulatory Visit: Payer: Self-pay | Admitting: Internal Medicine

## 2024-05-03 ENCOUNTER — Other Ambulatory Visit: Payer: Self-pay | Admitting: Internal Medicine

## 2024-05-08 ENCOUNTER — Other Ambulatory Visit: Payer: Self-pay | Admitting: Internal Medicine

## 2024-06-06 ENCOUNTER — Other Ambulatory Visit: Payer: Self-pay | Admitting: Internal Medicine

## 2024-06-24 ENCOUNTER — Telehealth: Payer: Self-pay | Admitting: Internal Medicine

## 2024-06-24 NOTE — Telephone Encounter (Signed)
 Contacted Angelica Patton to schedule their annual wellness visit. Patient declined to schedule AWV at this time. Patient has moved to [facility], now has a PCP there.  [name] Care Guide [facility] AWV TEAM Direct Dial: [phone]

## 2024-06-26 DIAGNOSIS — I10 Essential (primary) hypertension: Secondary | ICD-10-CM | POA: Diagnosis not present

## 2024-06-26 DIAGNOSIS — Z1211 Encounter for screening for malignant neoplasm of colon: Secondary | ICD-10-CM | POA: Diagnosis not present

## 2024-07-14 DIAGNOSIS — I10 Essential (primary) hypertension: Secondary | ICD-10-CM | POA: Diagnosis not present

## 2024-07-14 DIAGNOSIS — I2510 Atherosclerotic heart disease of native coronary artery without angina pectoris: Secondary | ICD-10-CM | POA: Diagnosis not present

## 2024-07-14 DIAGNOSIS — M810 Age-related osteoporosis without current pathological fracture: Secondary | ICD-10-CM | POA: Diagnosis not present

## 2024-07-17 DIAGNOSIS — H524 Presbyopia: Secondary | ICD-10-CM | POA: Diagnosis not present

## 2024-07-17 DIAGNOSIS — H25813 Combined forms of age-related cataract, bilateral: Secondary | ICD-10-CM | POA: Diagnosis not present

## 2024-07-17 DIAGNOSIS — H43813 Vitreous degeneration, bilateral: Secondary | ICD-10-CM | POA: Diagnosis not present

## 2024-07-28 DIAGNOSIS — Z79899 Other long term (current) drug therapy: Secondary | ICD-10-CM | POA: Diagnosis not present

## 2024-07-28 DIAGNOSIS — I2510 Atherosclerotic heart disease of native coronary artery without angina pectoris: Secondary | ICD-10-CM | POA: Diagnosis not present

## 2024-07-28 DIAGNOSIS — R061 Stridor: Secondary | ICD-10-CM | POA: Diagnosis not present

## 2024-07-28 DIAGNOSIS — K648 Other hemorrhoids: Secondary | ICD-10-CM | POA: Diagnosis not present

## 2024-07-28 DIAGNOSIS — F419 Anxiety disorder, unspecified: Secondary | ICD-10-CM | POA: Diagnosis not present

## 2024-07-28 DIAGNOSIS — I10 Essential (primary) hypertension: Secondary | ICD-10-CM | POA: Diagnosis not present

## 2024-07-28 DIAGNOSIS — Z7985 Long-term (current) use of injectable non-insulin antidiabetic drugs: Secondary | ICD-10-CM | POA: Diagnosis not present

## 2024-07-28 DIAGNOSIS — K5730 Diverticulosis of large intestine without perforation or abscess without bleeding: Secondary | ICD-10-CM | POA: Diagnosis not present

## 2024-07-28 DIAGNOSIS — Z7983 Long term (current) use of bisphosphonates: Secondary | ICD-10-CM | POA: Diagnosis not present

## 2024-07-28 DIAGNOSIS — Z87891 Personal history of nicotine dependence: Secondary | ICD-10-CM | POA: Diagnosis not present

## 2024-07-28 DIAGNOSIS — F32A Depression, unspecified: Secondary | ICD-10-CM | POA: Diagnosis not present

## 2024-07-28 DIAGNOSIS — Z1211 Encounter for screening for malignant neoplasm of colon: Secondary | ICD-10-CM | POA: Diagnosis not present

## 2024-07-29 ENCOUNTER — Encounter: Payer: Medicare HMO | Admitting: Internal Medicine

## 2024-08-09 DIAGNOSIS — M7989 Other specified soft tissue disorders: Secondary | ICD-10-CM | POA: Diagnosis not present

## 2024-08-09 DIAGNOSIS — W0110XA Fall on same level from slipping, tripping and stumbling with subsequent striking against unspecified object, initial encounter: Secondary | ICD-10-CM | POA: Diagnosis not present

## 2024-08-09 DIAGNOSIS — W101XXA Fall (on)(from) sidewalk curb, initial encounter: Secondary | ICD-10-CM | POA: Diagnosis not present

## 2024-08-09 DIAGNOSIS — S52501A Unspecified fracture of the lower end of right radius, initial encounter for closed fracture: Secondary | ICD-10-CM | POA: Diagnosis not present

## 2024-08-09 DIAGNOSIS — S60221A Contusion of right hand, initial encounter: Secondary | ICD-10-CM | POA: Diagnosis not present

## 2024-08-09 DIAGNOSIS — R9389 Abnormal findings on diagnostic imaging of other specified body structures: Secondary | ICD-10-CM | POA: Diagnosis not present

## 2024-08-09 DIAGNOSIS — S0990XA Unspecified injury of head, initial encounter: Secondary | ICD-10-CM | POA: Diagnosis not present

## 2024-08-09 DIAGNOSIS — S60211A Contusion of right wrist, initial encounter: Secondary | ICD-10-CM | POA: Diagnosis not present

## 2024-08-09 DIAGNOSIS — S52571A Other intraarticular fracture of lower end of right radius, initial encounter for closed fracture: Secondary | ICD-10-CM | POA: Diagnosis not present

## 2024-08-09 DIAGNOSIS — R930 Abnormal findings on diagnostic imaging of skull and head, not elsewhere classified: Secondary | ICD-10-CM | POA: Diagnosis not present

## 2024-08-11 DIAGNOSIS — S52591A Other fractures of lower end of right radius, initial encounter for closed fracture: Secondary | ICD-10-CM | POA: Diagnosis not present

## 2024-08-11 DIAGNOSIS — W010XXA Fall on same level from slipping, tripping and stumbling without subsequent striking against object, initial encounter: Secondary | ICD-10-CM | POA: Diagnosis not present

## 2024-08-13 DIAGNOSIS — Z09 Encounter for follow-up examination after completed treatment for conditions other than malignant neoplasm: Secondary | ICD-10-CM | POA: Diagnosis not present

## 2024-08-13 DIAGNOSIS — S52501D Unspecified fracture of the lower end of right radius, subsequent encounter for closed fracture with routine healing: Secondary | ICD-10-CM | POA: Diagnosis not present

## 2024-08-13 DIAGNOSIS — I10 Essential (primary) hypertension: Secondary | ICD-10-CM | POA: Diagnosis not present

## 2024-08-13 DIAGNOSIS — D329 Benign neoplasm of meninges, unspecified: Secondary | ICD-10-CM | POA: Diagnosis not present

## 2024-08-27 DIAGNOSIS — M25511 Pain in right shoulder: Secondary | ICD-10-CM | POA: Diagnosis not present

## 2024-08-27 DIAGNOSIS — S52591D Other fractures of lower end of right radius, subsequent encounter for closed fracture with routine healing: Secondary | ICD-10-CM | POA: Diagnosis not present

## 2024-08-27 DIAGNOSIS — W010XXD Fall on same level from slipping, tripping and stumbling without subsequent striking against object, subsequent encounter: Secondary | ICD-10-CM | POA: Diagnosis not present

## 2024-09-17 DIAGNOSIS — D329 Benign neoplasm of meninges, unspecified: Secondary | ICD-10-CM | POA: Diagnosis not present

## 2025-04-09 ENCOUNTER — Ambulatory Visit (HOSPITAL_BASED_OUTPATIENT_CLINIC_OR_DEPARTMENT_OTHER): Payer: Self-pay | Admitting: Obstetrics & Gynecology
